# Patient Record
Sex: Female | Born: 2000 | Race: White | Hispanic: Yes | Marital: Single | Smoking: Never smoker
Health system: Southern US, Community
[De-identification: ages and names within clinical notes are randomized; demographics above are authoritative.]

## PROBLEM LIST (undated history)

## (undated) DIAGNOSIS — J45909 Unspecified asthma, uncomplicated: Secondary | ICD-10-CM

## (undated) DIAGNOSIS — F329 Major depressive disorder, single episode, unspecified: Secondary | ICD-10-CM

## (undated) DIAGNOSIS — F32A Depression, unspecified: Secondary | ICD-10-CM

## (undated) DIAGNOSIS — Z8709 Personal history of other diseases of the respiratory system: Secondary | ICD-10-CM

## (undated) HISTORY — DX: Personal history of other diseases of the respiratory system: Z87.09

## (undated) HISTORY — PX: NO PAST SURGERIES: SHX2092

## (undated) HISTORY — DX: Depression, unspecified: F32.A

## (undated) HISTORY — PX: OTHER SURGICAL HISTORY: SHX169

## (undated) HISTORY — DX: Unspecified asthma, uncomplicated: J45.909

---
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
## 2018-01-21 DIAGNOSIS — F321 Major depressive disorder, single episode, moderate: Secondary | ICD-10-CM | POA: Insufficient documentation

## 2018-08-26 DIAGNOSIS — F321 Major depressive disorder, single episode, moderate: Secondary | ICD-10-CM

## 2018-08-27 ENCOUNTER — Other Ambulatory Visit: Payer: Self-pay

## 2018-08-27 ENCOUNTER — Ambulatory Visit (LOCAL_COMMUNITY_HEALTH_CENTER): Payer: Self-pay

## 2018-08-27 VITALS — BP 110/68 | Ht 65.0 in | Wt 146.5 lb

## 2018-08-27 DIAGNOSIS — Z3201 Encounter for pregnancy test, result positive: Secondary | ICD-10-CM

## 2018-08-27 LAB — PREGNANCY, URINE: Preg Test, Ur: POSITIVE — AB

## 2018-08-27 MED ORDER — PRENATAL VITAMIN 27-0.8 MG PO TABS: 1.0000 | ORAL_TABLET | Freq: Every day | ORAL | 0 refills | Status: DC

## 2018-08-27 NOTE — Progress Notes (Signed)
Accompanied by female during visit today. Denies any medical hx or surgical hx. Due to PHQ-9 score, offered opportunity to talk with provider today. Client would like to speak with provider today. Client escorted to [place] #5 and Jerline Pain FNP-BC aware. Female with client aware he will need to wait in waiting room when provider comes in to talk with client. Shona Needles, RN

## 2018-10-08 NOTE — Progress Notes (Signed)
Chart abstraction per 10/08/18 phone interview with Tawanna Solo, RN Debera Lat, RN

## 2018-10-09 ENCOUNTER — Other Ambulatory Visit: Payer: Self-pay

## 2018-10-09 ENCOUNTER — Ambulatory Visit: Payer: Medicaid Other | Admitting: Advanced Practice Midwife

## 2018-10-09 ENCOUNTER — Encounter: Payer: Self-pay | Admitting: Advanced Practice Midwife

## 2018-10-09 VITALS — BP 106/70 | Temp 98.6°F | Wt 142.8 lb

## 2018-10-09 DIAGNOSIS — O0990 Supervision of high risk pregnancy, unspecified, unspecified trimester: Secondary | ICD-10-CM

## 2018-10-09 DIAGNOSIS — O99340 Other mental disorders complicating pregnancy, unspecified trimester: Secondary | ICD-10-CM

## 2018-10-09 DIAGNOSIS — J45909 Unspecified asthma, uncomplicated: Secondary | ICD-10-CM

## 2018-10-09 DIAGNOSIS — F329 Major depressive disorder, single episode, unspecified: Secondary | ICD-10-CM

## 2018-10-09 DIAGNOSIS — F32A Depression, unspecified: Secondary | ICD-10-CM

## 2018-10-09 LAB — HIV ANTIBODY (ROUTINE TESTING W REFLEX): HIV 1&2 Ab, 4th Generation: NONREACTIVE

## 2018-10-09 LAB — URINALYSIS
Bilirubin, UA: NEGATIVE
Glucose, UA: NEGATIVE
Leukocytes,UA: NEGATIVE
Nitrite, UA: NEGATIVE
RBC, UA: NEGATIVE
Specific Gravity, UA: 1.03 (ref 1.005–1.030)
Urobilinogen, Ur: 0.2 mg/dL (ref 0.2–1.0)
pH, UA: 6 (ref 5.0–7.5)

## 2018-10-09 LAB — HEMOGLOBIN, FINGERSTICK: Hemoglobin: 11.9 g/dL (ref 11.1–15.9)

## 2018-10-09 LAB — WET PREP FOR TRICH, YEAST, CLUE
Trichomonas Exam: NEGATIVE
Yeast Exam: NEGATIVE

## 2018-10-09 LAB — OB RESULTS CONSOLE VARICELLA ZOSTER ANTIBODY, IGG: Varicella: NON-IMMUNE/NOT IMMUNE

## 2018-10-09 NOTE — Progress Notes (Signed)
In for new ob; has PNV; c/o n/v ~2x/day; initial labs today; Debera Lat, RN Peak flows: [phone] with moderate effort; informed will be called with 1st screen appt. Debera Lat, RN

## 2018-10-09 NOTE — Progress Notes (Signed)
[facility] HEALTH DEPT [facility] [address] [place] [address] [phone]  INITIAL PRENATAL VISIT NOTE  Subjective:  Amber Potter is a 18 y.o.SHF nonsmoker G1P0 at [redacted]w[redacted]d being seen today to start prenatal care at the [facility] Department.  She is currently monitored for the following issues for this high-risk pregnancy and has Major depressive disorder, single episode, moderate (HCC); Asthma; and Supervision of high risk pregnancy, antepartum on their problem list.  Feels "ok" about unplanned pregnancy.  In [place] x1 year, in  x 15 days.  Living with FOB and in 1 year relationship.  Pt states she was in a detention center in [place] x 2 months and then sent to a hospital in [place] x 1 week and diagnosed with depression but given no meds.  Pt obviously depressed and declines need for counseling.   States LMP 07/14/18  Patient reports no complaints.  Contractions: Not present. Vag. Bleeding: None.  Movement: Absent. Denies leaking of fluid.   The following portions of the patient's history were reviewed and updated as appropriate: allergies, current medications, past family history, past medical history, past social history, past surgical history and problem list. Problem list updated.  Objective:   Vitals:   10/09/18 1330  BP: 106/70  Temp: 98.6 F (37 C)  Weight: 142 lb 12.8 oz (64.8 kg)    Fetal Status: Fetal Heart Rate (bpm): 160 Fundal Height: 12 cm Movement: Absent     Physical Exam Constitutional:      Appearance: Normal appearance.  HENT:     Head: Normocephalic and atraumatic.     Nose: Nose normal.     Mouth/Throat:     Mouth: Mucous membranes are moist.  Eyes:     Conjunctiva/sclera: Conjunctivae normal.  Neck:     Musculoskeletal: Neck supple.  Cardiovascular:     Rate and Rhythm: Normal rate and regular rhythm.  Pulmonary:     Effort: Pulmonary effort is normal.     Breath  sounds: Normal breath sounds.  Abdominal:     Palpations: Abdomen is soft.     Tenderness: There is no abdominal tenderness. There is no guarding.     Hernia: No hernia is present.     Comments: Soft without tenderness, fundus 2 FBaS, FHR=160  Genitourinary:    General: Normal vulva.     Exam position: Lithotomy position.     Labia:        Right: No lesion.        Left: No lesion.      Vagina: Vaginal discharge (white creamy, ph<4.5) present.     Cervix: Normal.     Uterus: Enlarged (10 wks size).      Adnexa: Right adnexa normal and left adnexa normal.     Rectum: Normal.  Skin:    General: Skin is dry.  Neurological:     Mental Status: She is alert.  Psychiatric:        Mood and Affect: Mood is depressed. Affect is blunt and flat.        Speech: Speech is delayed.        Behavior: Behavior is slowed and withdrawn.     Assessment and Plan:  Pregnancy: G1P0 at [redacted]w[redacted]d  1. Supervision of high risk pregnancy, antepartum Pt refuses counseling.  Monitor for depression and reoffer.  Desires CP. Desires FIRST screen.  C/o n&v--suggestions given to eat small high protein snacks q 2 hours, ginger tea, vit  B6 25 mg BID, pt desires rx for Phenergan 25 mg prn #8.  To ER if worsens  - Prenatal Profile with Varicella(282020) - HGB FRAC. W/SOLUBILITY - QuantiFERON-TB Gold Plus - Chlamydia/GC NAA, Confirmation - HIV [facility] - 952841 Drug Screen - Urine Culture & Sensitivity - Urinalysis (Urine Dip) - WET PREP FOR TRICH, YEAST, CLUE 2.  Asthma Peak flows please today    Discussed overview of care and coordination with inpatient delivery practices including [facility], [facility], [facility] and [facility].   Reviewed Centering pregnancy as standard of care at [facility], oriented to room and showed video. Based on EDD, plan for Cycle  .    Preterm labor symptoms and general obstetric precautions including but not limited to vaginal bleeding, contractions, leaking of fluid and fetal  movement were reviewed in detail with the patient.  Please refer to After Visit Summary for other counseling recommendations.   No follow-ups on file.  No future appointments.  Herbie Saxon, CNM

## 2018-10-10 LAB — 789231 7+OXYCODONE-BUND
Amphetamines, Urine: NEGATIVE ng/mL
BENZODIAZ UR QL: NEGATIVE ng/mL
Barbiturate screen, urine: NEGATIVE ng/mL
Cannabinoid Quant, Ur: NEGATIVE ng/mL
Cocaine (Metab.): NEGATIVE ng/mL
OPIATE SCREEN URINE: NEGATIVE ng/mL
Oxycodone/Oxymorphone, Urine: NEGATIVE ng/mL
PCP Quant, Ur: NEGATIVE ng/mL

## 2018-10-11 LAB — CBC/D/PLT+RPR+RH+ABO+RUB AB...
Antibody Screen: NEGATIVE
Basophils Absolute: 0 10*3/uL (ref 0.0–0.2)
Basos: 0 %
EOS (ABSOLUTE): 0.2 10*3/uL (ref 0.0–0.4)
Eos: 2 %
Hematocrit: 37.4 % (ref 34.0–46.6)
Hemoglobin: 11.9 g/dL (ref 11.1–15.9)
Hepatitis B Surface Ag: NEGATIVE
Immature Grans (Abs): 0.1 10*3/uL (ref 0.0–0.1)
Immature Granulocytes: 1 %
Lymphocytes Absolute: 3.2 10*3/uL — ABNORMAL HIGH (ref 0.7–3.1)
Lymphs: 26 %
MCH: 27.1 pg (ref 26.6–33.0)
MCHC: 31.8 g/dL (ref 31.5–35.7)
MCV: 85 fL (ref 79–97)
Monocytes Absolute: 1 10*3/uL — ABNORMAL HIGH (ref 0.1–0.9)
Monocytes: 8 %
Neutrophils Absolute: 7.9 10*3/uL — ABNORMAL HIGH (ref 1.4–7.0)
Neutrophils: 63 %
Platelets: 376 10*3/uL (ref 150–450)
RBC: 4.39 x10E6/uL (ref 3.77–5.28)
RDW: 12.8 % (ref 11.7–15.4)
RPR Ser Ql: NONREACTIVE
Rh Factor: POSITIVE
Rubella Antibodies, IGG: 9.37 index (ref >0.99)
Varicella zoster IgG: <135 index — ABNORMAL LOW (ref >165)
WBC: 12.3 10*3/uL — ABNORMAL HIGH (ref 3.4–10.8)

## 2018-10-11 LAB — HGB FRAC. W/SOLUBILITY
Hgb A2 Quant: 2.1 % (ref 1.8–3.2)
Hgb A: 97.9 % (ref 96.4–98.8)
Hgb C: 0 %
Hgb F Quant: 0 % (ref 0.0–2.0)
Hgb S: 0 %
Hgb Solubility: NEGATIVE
Hgb Variant: 0 %

## 2018-10-11 LAB — QUANTIFERON-TB GOLD PLUS
QuantiFERON Mitogen Value: >10 IU/mL
QuantiFERON Nil Value: 0.03 IU/mL
QuantiFERON TB1 Ag Value: 0.03 IU/mL
QuantiFERON TB2 Ag Value: 0.03 IU/mL
QuantiFERON-TB Gold Plus: NEGATIVE

## 2018-10-11 LAB — URINE CULTURE: Organism ID, Bacteria: NO GROWTH

## 2018-10-14 ENCOUNTER — Encounter: Payer: Self-pay | Admitting: Advanced Practice Midwife

## 2018-10-14 DIAGNOSIS — O09892 Supervision of other high risk pregnancies, second trimester: Secondary | ICD-10-CM | POA: Insufficient documentation

## 2018-10-14 DIAGNOSIS — R799 Abnormal finding of blood chemistry, unspecified: Secondary | ICD-10-CM | POA: Insufficient documentation

## 2018-10-14 LAB — CHLAMYDIA/GC NAA, CONFIRMATION
Chlamydia trachomatis, NAA: NEGATIVE
Neisseria gonorrhoeae, NAA: NEGATIVE

## 2018-11-06 ENCOUNTER — Telehealth: Payer: Self-pay

## 2018-11-06 ENCOUNTER — Ambulatory Visit: Payer: Self-pay

## 2018-11-06 NOTE — Telephone Encounter (Signed)
TC to patient for maternity telehealth appointment. LM with number to call on home phone and LM on cell phone as well. Interpreter M. [place] ([place]). Patient has now disappeared off today's schedule and apparently has rescheduled for 11/08/2018.Marland KitchenJenetta Downer, RN

## 2018-11-08 ENCOUNTER — Telehealth: Payer: Self-pay | Admitting: Family Medicine

## 2018-11-08 ENCOUNTER — Ambulatory Visit: Payer: Medicaid Other

## 2018-11-08 ENCOUNTER — Telehealth: Payer: Self-pay

## 2018-11-08 NOTE — Telephone Encounter (Signed)
pls call I missed my appt call

## 2018-11-08 NOTE — Telephone Encounter (Signed)
Attempted to call for [facility] Telehealth visit via interpreter M. [place]); female answered mobile and hung up; call to home # & per female she will pass message Debera Lat, RN

## 2018-11-08 NOTE — Telephone Encounter (Signed)
Reached client and rescheduled [place] Telehealth appt to 11/12/18; reports to call mobile# Debera Lat, RN

## 2018-11-12 ENCOUNTER — Ambulatory Visit: Payer: Self-pay

## 2018-11-14 ENCOUNTER — Other Ambulatory Visit: Payer: Self-pay

## 2018-11-14 ENCOUNTER — Ambulatory Visit: Payer: Medicaid Other | Admitting: Physician Assistant

## 2018-11-14 VITALS — BP 111/71 | Temp 99.1°F | Wt 142.8 lb

## 2018-11-14 DIAGNOSIS — O0990 Supervision of high risk pregnancy, unspecified, unspecified trimester: Secondary | ICD-10-CM

## 2018-11-14 DIAGNOSIS — F321 Major depressive disorder, single episode, moderate: Secondary | ICD-10-CM

## 2018-11-14 DIAGNOSIS — J45909 Unspecified asthma, uncomplicated: Secondary | ICD-10-CM

## 2018-11-14 DIAGNOSIS — O09892 Supervision of other high risk pregnancies, second trimester: Secondary | ICD-10-CM

## 2018-11-14 DIAGNOSIS — R799 Abnormal finding of blood chemistry, unspecified: Secondary | ICD-10-CM

## 2018-11-14 NOTE — Progress Notes (Signed)
Patient was here for MH RV at 17 3/7. Quad screen today. [facility] U/S referral faxed for anatomy U/S. Appointment pending. Varicella non-immune counseling done today and handout given. Patient counseled she will need varicella vaccine following delivery of her baby.Jenetta Downer, RN

## 2018-11-14 NOTE — Progress Notes (Signed)
   PRENATAL VISIT NOTE  Subjective:  Amber Potter is a 18 y.o. G1P0 at [redacted]w[redacted]d being seen today for ongoing prenatal care.  She is currently monitored for the following issues for this low-risk pregnancy and has Major depressive disorder, single episode, moderate ([place]); Asthma; Supervision of high risk pregnancy, antepartum; Susceptible to Varicella (non-immune), currently pregnant in second trimester; and Abnormal blood finding on their problem list.  Patient reports no complaints.  Contractions: Not present. Vag. Bleeding: None.  Movement: Absent. Denies leaking of fluid/ROM.   The following portions of the patient's history were reviewed and updated as appropriate: allergies, current medications, past family history, past medical history, past social history, past surgical history and problem list. Problem list updated.  Objective:   Vitals:   11/14/18 0916  BP: 111/71  Temp: 99.1 F (37.3 C)  Weight: 142 lb 12.8 oz (64.8 kg)    Fetal Status: Fetal Heart Rate (bpm): 144 Fundal Height: 17 cm Movement: Absent     General:  Alert, oriented and cooperative. Patient is in no acute distress.  Skin: Skin is warm and dry. No rash noted.   Cardiovascular: Normal heart rate noted  Respiratory: Normal respiratory effort, no problems with respiration noted  Abdomen: Soft, gravid, appropriate for gestational age.  Pain/Pressure: Absent     Pelvic: Cervical exam deferred        Extremities: Normal range of motion.  Edema: None  Mental Status: Normal mood and affect. Normal behavior. Normal judgment and thought content.   Assessment and Plan:  Pregnancy: G1P0 at [redacted]w[redacted]d  1. Susceptible to Varicella (non-immune), currently pregnant in second trimester Counseled.  2. Major depressive disorder, single episode, moderate (HCC) States mood is "so-so". Is aware of LCSW counseling services avail - declines.  3. Asthma, unspecified asthma severity, unspecified whether complicated, unspecified  whether persistent Declines sx.  4. Abnormal blood finding Repeat WBC/diff today - White blood count and differential  5. Supervision of high risk pregnancy, antepartum Schedule anat [place], quad screen today. - QUAD Screen [facility] Only   Preterm labor symptoms and general obstetric precautions including but not limited to vaginal bleeding, contractions, leaking of fluid and fetal movement were reviewed in detail with the patient. Please refer to After Visit Summary for other counseling recommendations.  Return in about 4 weeks (around 12/12/2018) for Routine prenatal care, Telehealth.  No future appointments.  Lora Havens, PA-C

## 2018-11-15 ENCOUNTER — Other Ambulatory Visit: Payer: Self-pay | Admitting: Physician Assistant

## 2018-11-15 DIAGNOSIS — Z3492 Encounter for supervision of normal pregnancy, unspecified, second trimester: Secondary | ICD-10-CM

## 2018-11-15 LAB — WHITE BLOOD COUNT AND DIFFERENTIAL
Basophils Absolute: 0 10*3/uL (ref 0.0–0.2)
Basos: 0 %
EOS (ABSOLUTE): 0.1 10*3/uL (ref 0.0–0.4)
Eos: 1 %
Immature Grans (Abs): 0.2 10*3/uL — ABNORMAL HIGH (ref 0.0–0.1)
Immature Granulocytes: 2 %
Lymphocytes Absolute: 2.3 10*3/uL (ref 0.7–3.1)
Lymphs: 20 %
Monocytes Absolute: 0.8 10*3/uL (ref 0.1–0.9)
Monocytes: 7 %
Neutrophils Absolute: 8.2 10*3/uL — ABNORMAL HIGH (ref 1.4–7.0)
Neutrophils: 70 %
WBC: 11.7 10*3/uL — ABNORMAL HIGH (ref 3.4–10.8)

## 2018-11-18 ENCOUNTER — Telehealth: Payer: Self-pay

## 2018-11-18 NOTE — Telephone Encounter (Signed)
TC to patient to inform of WBC test results and ask patient if she has symptoms of an infections (ie. Fever, chills, body aches). WBC has come down from previous value, but is still elevated. Outgoing message on this phone was in [place], unsure if this is correct phone number. LM with number to call. Before this call, call was made to cell [phone] and female answered suggesting that we call patient's phone, and he verified that the home number we have in chart is patient's phone. Please verify patient phone numbers at next visit. Interpreter, M. [place] ([place]Jenetta Downer, RN

## 2018-11-19 NOTE — Telephone Encounter (Signed)
TC to patient, who states she has no fever, chills or body aches and feels fine. Patient informed that provider wanted RN to check on how she is feeling because although her WBC value is coming down, it is still slightly elevated. Explained to patient that high WBC can indicate an infection, and that because her WBC is decreasing from being elevated, this is a positive thing. Patient told we would recheck her WBC at next visit. Patient denies questions at this time. Interpreter V. [place].Jenetta Downer, RN

## 2018-11-27 ENCOUNTER — Other Ambulatory Visit: Payer: Self-pay

## 2018-11-27 ENCOUNTER — Ambulatory Visit
Admission: RE | Admit: 2018-11-27 | Discharge: 2018-11-27 | Disposition: A | Discharge status: Home / Self Care | Payer: Self-pay | Source: Ambulatory Visit | Attending: Physician Assistant | Admitting: Physician Assistant

## 2018-11-27 DIAGNOSIS — Z3492 Encounter for supervision of normal pregnancy, unspecified, second trimester: Secondary | ICD-10-CM | POA: Insufficient documentation

## 2018-11-28 ENCOUNTER — Encounter: Payer: Self-pay | Admitting: Physician Assistant

## 2018-11-28 DIAGNOSIS — O0990 Supervision of high risk pregnancy, unspecified, unspecified trimester: Secondary | ICD-10-CM

## 2018-12-11 ENCOUNTER — Encounter: Payer: Self-pay | Admitting: Advanced Practice Midwife

## 2018-12-11 ENCOUNTER — Other Ambulatory Visit: Payer: Self-pay

## 2018-12-11 ENCOUNTER — Ambulatory Visit: Payer: Self-pay | Admitting: Family Medicine

## 2018-12-11 VITALS — BP 95/69 | Temp 97.3°F | Wt 144.0 lb

## 2018-12-11 DIAGNOSIS — F321 Major depressive disorder, single episode, moderate: Secondary | ICD-10-CM

## 2018-12-11 DIAGNOSIS — R799 Abnormal finding of blood chemistry, unspecified: Secondary | ICD-10-CM

## 2018-12-11 DIAGNOSIS — O0990 Supervision of high risk pregnancy, unspecified, unspecified trimester: Secondary | ICD-10-CM

## 2018-12-11 DIAGNOSIS — O09892 Supervision of other high risk pregnancies, second trimester: Secondary | ICD-10-CM

## 2018-12-11 DIAGNOSIS — J45909 Unspecified asthma, uncomplicated: Secondary | ICD-10-CM

## 2018-12-11 MED ORDER — PRENATAL VITAMIN AND MINERAL 28-0.8 MG PO TABS: 1.0000 | ORAL_TABLET | Freq: Every day | ORAL | 0 refills | Status: DC

## 2018-12-11 NOTE — Progress Notes (Signed)
   PRENATAL VISIT NOTE  Subjective:  Amber Potter is a 18 y.o. G1P0 at [redacted]w[redacted]d being seen today for ongoing prenatal care.  She is currently monitored for the following issues for this high-risk pregnancy and has Major depressive disorder, single episode, moderate ([place]); Asthma; Supervision of high risk pregnancy, antepartum; Susceptible to Varicella (non-immune), currently pregnant in second trimester; and Abnormal blood finding 10/09/18 leukocytosis (WBC=12.3, 11.7) on their problem list. Pt's boyfriend is present during visit.  Patient reports no complaints. She has asthma, though denies wheeze, SOB or difficulty breathing. Has inhaler but has not needed to use.  WBC has been high past few visits. She denies feeling ill, fever, urinary problems, diarrhea or travel within the last year.    Contractions: Not present. Vag. Bleeding: None.  Movement: Absent. Denies leaking of fluid/ROM.   The following portions of the patient's history were reviewed and updated as appropriate: allergies, current medications, past family history, past medical history, past social history, past surgical history and problem list. Problem list updated.  Objective:   Vitals:   12/11/18 0841  BP: 95/69  Temp: (!) 97.3 F (36.3 C)  Weight: 144 lb (65.3 kg)    Fetal Status:     Movement: Absent     General:  Alert, oriented and cooperative. Patient is in no acute distress.  Skin: Skin is warm and dry. No rash noted.   Cardiovascular: Normal heart rate noted  Respiratory: Normal respiratory effort, no problems with respiration noted  Abdomen: Soft, gravid, appropriate for gestational age.  Pain/Pressure: Absent     Pelvic: Cervical exam deferred        Extremities: Normal range of motion.  Edema: None  Mental Status: Normal mood and affect. Normal behavior. Normal judgment and thought content.   Assessment and Plan:  Pregnancy: G1P0 at [redacted]w[redacted]d  1. Supervision of high risk pregnancy, antepartum Up to  date. Return in 4 wks  - Flu Vaccine QUAD 36+ mos IM - Prenatal Vit-Fe Fumarate-FA (PRENATAL VITAMIN AND MINERAL) 28-0.8 MG TABS; Take 1 tablet by mouth daily.  Dispense: 100 tablet; Refill: 0  2. Asthma, unspecified asthma severity, unspecified whether complicated, unspecified whether persistent Mild, no use of inhaler. Will continue to monitor.   3. Abnormal blood finding 10/09/18 leukocytosis (WBC=12.3, 11.7) No signs of infection upon questioning. Will repeat CBC today in follow up to abnormal findings 10/29 and 9/23. - CBC with Differential/Platelet  4. Major depressive disorder, single episode, moderate ([place]) She has declined behavioral health referral.   5. Susceptible to Varicella (non-immune), currently pregnant in second trimester Needs vaccine pp.   Preterm labor symptoms and general obstetric precautions including but not limited to vaginal bleeding, contractions, leaking of fluid and fetal movement were reviewed in detail with the patient. Please refer to After Visit Summary for other counseling recommendations.  No follow-ups on file.  No future appointments.  Kandee Keen, PA-C

## 2018-12-11 NOTE — Progress Notes (Signed)
In for visit; ran out of PNV-more given today; denies hospital visits; agrees to Flu vaccine-adm. R. delt well tolerated Debera Lat, RN

## 2018-12-12 LAB — CBC WITH DIFFERENTIAL/PLATELET
Basophils Absolute: 0 10*3/uL (ref 0.0–0.2)
Basos: 0 %
EOS (ABSOLUTE): 0.2 10*3/uL (ref 0.0–0.4)
Eos: 2 %
Hematocrit: 37.6 % (ref 34.0–46.6)
Hemoglobin: 11.6 g/dL (ref 11.1–15.9)
Immature Grans (Abs): 0.2 10*3/uL — ABNORMAL HIGH (ref 0.0–0.1)
Immature Granulocytes: 2 %
Lymphocytes Absolute: 2.3 10*3/uL (ref 0.7–3.1)
Lymphs: 20 %
MCH: 27.1 pg (ref 26.6–33.0)
MCHC: 30.9 g/dL — ABNORMAL LOW (ref 31.5–35.7)
MCV: 88 fL (ref 79–97)
Monocytes Absolute: 0.6 10*3/uL (ref 0.1–0.9)
Monocytes: 6 %
Neutrophils Absolute: 7.9 10*3/uL — ABNORMAL HIGH (ref 1.4–7.0)
Neutrophils: 70 %
Platelets: 354 10*3/uL (ref 150–450)
RBC: 4.28 x10E6/uL (ref 3.77–5.28)
RDW: 12 % (ref 11.7–15.4)
WBC: 11.2 10*3/uL — ABNORMAL HIGH (ref 3.4–10.8)

## 2019-01-08 ENCOUNTER — Other Ambulatory Visit: Payer: Self-pay

## 2019-01-08 ENCOUNTER — Ambulatory Visit: Payer: Self-pay | Admitting: Physician Assistant

## 2019-01-08 ENCOUNTER — Ambulatory Visit: Payer: Self-pay

## 2019-01-08 VITALS — BP 103/63 | HR 93 | Temp 99.0°F | Wt 148.2 lb

## 2019-01-08 DIAGNOSIS — Z3490 Encounter for supervision of normal pregnancy, unspecified, unspecified trimester: Secondary | ICD-10-CM

## 2019-01-08 DIAGNOSIS — F321 Major depressive disorder, single episode, moderate: Secondary | ICD-10-CM

## 2019-01-08 DIAGNOSIS — O0990 Supervision of high risk pregnancy, unspecified, unspecified trimester: Secondary | ICD-10-CM

## 2019-01-08 DIAGNOSIS — O09892 Supervision of other high risk pregnancies, second trimester: Secondary | ICD-10-CM

## 2019-01-08 DIAGNOSIS — Z2839 Other underimmunization status: Secondary | ICD-10-CM

## 2019-01-08 DIAGNOSIS — Z550 Illiteracy and low-level literacy: Secondary | ICD-10-CM

## 2019-01-08 NOTE — Progress Notes (Signed)
In for visit; taking PNV; denies hospital visits; undecided on [place]. Given; [place] forms completed Debera Lat, RN

## 2019-01-08 NOTE — Progress Notes (Signed)
   PRENATAL VISIT NOTE  Subjective:  Amber Potter is a 18 y.o. G1P0 at [redacted]w[redacted]d being seen today for ongoing prenatal care.  She is currently monitored for the following issues for this high-risk pregnancy and has Major depressive disorder, single episode, moderate ([place]); Asthma; Supervision of high risk pregnancy, antepartum; Susceptible to Varicella (non-immune), currently pregnant in second trimester; and Abnormal blood finding 10/09/18 leukocytosis (WBC=12.3, 11.7) on their problem list.  Patient reports no complaints.  Contractions: Not present. Vag. Bleeding: None.  Movement: Present. Denies leaking of fluid/ROM.   The following portions of the patient's history were reviewed and updated as appropriate: allergies, current medications, past family history, past medical history, past social history, past surgical history and problem list. Problem list updated.  Objective:   Vitals:   01/08/19 0851  BP: 103/63  Pulse: 93  Temp: 99 F (37.2 C)  Weight: 148 lb 3.2 oz (67.2 kg)    Fetal Status: Fetal Heart Rate (bpm): 144 Fundal Height: 24 cm Movement: Present     General:  Alert, oriented and cooperative. Patient is in no acute distress.  Skin: Skin is warm and dry. No rash noted.   Cardiovascular: Normal heart rate noted  Respiratory: Normal respiratory effort, no problems with respiration noted  Abdomen: Soft, gravid, appropriate for gestational age.  Pain/Pressure: Absent     Pelvic: Cervical exam deferred        Extremities: Normal range of motion.  Edema: None  Mental Status: Normal mood and affect. Normal behavior. Normal judgment and thought content.   Assessment and Plan:  Pregnancy: G1P0 at [redacted]w[redacted]d  1. Susceptible to Varicella (non-immune), currently pregnant in second trimester Plan pp vaccination.  2. Supervision of high risk pregnancy, antepartum Doing well. Anticipatory guidance re: 28-wk labs at RV.  3. Major depressive disorder, single episode, moderate  (HCC) EPDS = 5 today, continue to monitor mood.   Preterm labor symptoms and general obstetric precautions including but not limited to vaginal bleeding, contractions, leaking of fluid and fetal movement were reviewed in detail with the patient. Please refer to After Visit Summary for other counseling recommendations.  Return in about 2 weeks (around 01/22/2019) for Routine prenatal care, 28 wk labs.  No future appointments.  Lora Havens, PA-C

## 2019-01-17 NOTE — L&D Delivery Note (Signed)
Delivery Note  Amber Potter is a G1P0 at [redacted]w[redacted]d with an LMP of 07/14/2018, consistent with [place] at [redacted]w[redacted]d.   First Stage: Labor onset: 1600 04/22/2019 Augmentation : with pitocin  Analgesia /Anesthesia intrapartum: Epidural SROM at 0935  Second Stage: Complete dilation at 1438 Onset of pushing at 1502 FHR second stage 120bpm with moderate variability, intermittent variables, prolong decel terminating in birth of baby  Amber Potter presented to L&D with regular, painful contractions, in early labor.  Labor augmented with pitocin.  Progressed well to c/c/+3 and had excellent maternal effort with pushing.   Delivery of a viable baby boy 4/7/2021at 1508 by CNM Delivery of fetal head in OA position with restitution to LOT. No nuchal cord;  Anterior then posterior shoulders delivered easily with gentle downward traction. Baby placed on mom's chest, and attended to by peds. Cord double clamped after cessation of pulsation, cut by father of baby   Cord blood sample collected: yes, [place] Collection of cord blood donation: N/A Arterial cord blood sample: N/A  Third Stage: Oxytocin bolus started after delivery of infant for hemorrhage prophylaxis  Placenta delivered intact with 3 VC @ 1514, meconium stained  Placenta disposition: discarded  Uterine tone firm / bleeding moderate  2nd degree perineal laceration identified  Anesthesia for repair: epidural Repair with 2-0 Vicryl on CT  Est. Blood Loss (mL): 225  Complications: Meconium stained fluid   Mom to postpartum.  Baby to Couplet care / Skin to Skin.  Newborn: Baby boy [place] Birth Weight: pending   Apgar Scores: 8/8 Feeding planned: breast/formula   ---------- Margaretmary Eddy, CNM Certified Nurse Midwife [facility] OB[facility]

## 2019-01-22 ENCOUNTER — Other Ambulatory Visit: Payer: Self-pay

## 2019-01-22 ENCOUNTER — Ambulatory Visit: Payer: Self-pay | Admitting: Family Medicine

## 2019-01-22 ENCOUNTER — Encounter: Payer: Self-pay | Admitting: Family Medicine

## 2019-01-22 VITALS — BP 112/62 | HR 90 | Temp 97.0°F | Wt 146.4 lb

## 2019-01-22 DIAGNOSIS — O0990 Supervision of high risk pregnancy, unspecified, unspecified trimester: Secondary | ICD-10-CM

## 2019-01-22 DIAGNOSIS — Z3490 Encounter for supervision of normal pregnancy, unspecified, unspecified trimester: Secondary | ICD-10-CM

## 2019-01-22 DIAGNOSIS — O09892 Supervision of other high risk pregnancies, second trimester: Secondary | ICD-10-CM

## 2019-01-22 DIAGNOSIS — J45909 Unspecified asthma, uncomplicated: Secondary | ICD-10-CM

## 2019-01-22 DIAGNOSIS — O99619 Diseases of the digestive system complicating pregnancy, unspecified trimester: Secondary | ICD-10-CM

## 2019-01-22 DIAGNOSIS — K219 Gastro-esophageal reflux disease without esophagitis: Secondary | ICD-10-CM

## 2019-01-22 DIAGNOSIS — Z23 Encounter for immunization: Secondary | ICD-10-CM

## 2019-01-22 DIAGNOSIS — F321 Major depressive disorder, single episode, moderate: Secondary | ICD-10-CM

## 2019-01-22 LAB — URINALYSIS
Bilirubin, UA: NEGATIVE
Glucose, UA: NEGATIVE
Ketones, UA: NEGATIVE
Leukocytes,UA: NEGATIVE
Nitrite, UA: NEGATIVE
Protein,UA: NEGATIVE
Specific Gravity, UA: 1.025 (ref 1.005–1.030)
Urobilinogen, Ur: 0.2 mg/dL (ref 0.2–1.0)
pH, UA: 6.5 (ref 5.0–7.5)

## 2019-01-22 LAB — HEMOGLOBIN, FINGERSTICK: Hemoglobin: 11.2 g/dL (ref 11.1–15.9)

## 2019-01-22 LAB — OB RESULTS CONSOLE HIV ANTIBODY (ROUTINE TESTING): HIV: NONREACTIVE

## 2019-01-22 NOTE — Progress Notes (Signed)
In for visit; denies hospital visits; taking PNV; 28 wk. Labs today; agrees to Tdap; undecided on [facility]; c/o N/V q. Day Sharlette Dense, RN

## 2019-01-22 NOTE — Progress Notes (Signed)
   PRENATAL VISIT NOTE  Subjective:  Amber Potter is a 19 y.o. G1P0 at [redacted]w[redacted]d being seen today for ongoing prenatal care.  She is currently monitored for the following issues for this low-risk pregnancy and has Major depressive disorder, single episode, moderate (HCC); Asthma; Supervision of high risk pregnancy, antepartum; Susceptible to Varicella (non-immune), currently pregnant in second trimester; Abnormal blood finding 10/09/18 leukocytosis (WBC=12.3, 11.7); and Illiteracy on their problem list.  Patient reports heartburn and vomiting x 2 wks. Burning in upper abd and throat, vomits <2 hrs after eating.  Contractions: Not present. Vag. Bleeding: None.  Movement: Present. Denies leaking of fluid/ROM.   The following portions of the patient's history were reviewed and updated as appropriate: allergies, current medications, past family history, past medical history, past social history, past surgical history and problem list. Problem list updated.  Objective:   Vitals:   01/22/19 0908  BP: 112/62  Pulse: 90  Temp: (!) 97 F (36.1 C)  Weight: 146 lb 6.4 oz (66.4 kg)    Fetal Status: Fetal Heart Rate (bpm): 150 Fundal Height: 26 cm Movement: Present     General:  Alert, oriented and cooperative. Patient is in no acute distress.  Skin: Skin is warm and dry. No rash noted.   Cardiovascular: Normal heart rate noted  Respiratory: Normal respiratory effort, no problems with respiration noted  Abdomen: Soft, gravid, appropriate for gestational age.  Pain/Pressure: Absent     Pelvic: Cervical exam deferred        Extremities: Normal range of motion.  Edema: None  Mental Status: Normal mood and affect. Normal behavior. Normal judgment and thought content.   Assessment and Plan:  Pregnancy: G1P0 at [redacted]w[redacted]d     1. Supervision of high risk pregnancy, antepartum 28 wk labs today. Advised of appts every 2 wks now.  - HIV [place] [facility] - Glucose, 1 hour gestational - RPR - Hemoglobin,  venipuncture - Tdap vaccine greater than or equal to 7yo IM  2. Gastroesophageal reflux in pregnancy -Advised eating small meals and avoiding common trigger foods/drinks, elevating head at nighttime, not reclining immediately after eating, and using Tums as needed. If that fails advised trial of famotidine (Pepcid) 10mg  twice per day. Handout with information given/pictures drawn as well.  -Urine obtained today d/t reports of frequent vomiting, results without ketones. If vomiting does not resolve with GERD tx advised pt to RTC.  3. Major depressive disorder, single episode, moderate (HCC) Mood discussed, pt states she is doing ok, has lots of stress but declines beh health referral.  4. Susceptible to Varicella (non-immune), currently pregnant in second trimester Pp vaccination  5. Uncomplicated asthma, unspecified asthma severity, unspecified whether persistent Pt has not needed albuterol recently.   6. Need for tetanus, diphtheria, and acellular pertussis (Tdap) vaccine - Tdap vaccine greater than or equal to 7yo IM    Preterm labor symptoms and general obstetric precautions including but not limited to vaginal bleeding, contractions, leaking of fluid and fetal movement were reviewed in detail with the patient. Please refer to After Visit Summary for other counseling recommendations.  Return in about 2 weeks (around 02/05/2019) for routine prenatal care.  No future appointments.  02/07/2019, PA-C

## 2019-01-23 ENCOUNTER — Telehealth: Payer: Self-pay

## 2019-01-23 DIAGNOSIS — O99810 Abnormal glucose complicating pregnancy: Secondary | ICD-10-CM | POA: Insufficient documentation

## 2019-01-23 LAB — RPR: RPR Ser Ql: NONREACTIVE

## 2019-01-23 LAB — GLUCOSE, 1 HOUR GESTATIONAL: Gestational Diabetes Screen: 139 mg/dL (ref 65–139)

## 2019-01-23 NOTE — Telephone Encounter (Signed)
Call to client via interpreter V. [place]-discussed abnl glucose result & 3 Hr Gtt scheduled 01/27/19 @ 8:15.  Instructions given Sharlette Dense, RN

## 2019-01-27 ENCOUNTER — Other Ambulatory Visit: Payer: Self-pay

## 2019-01-27 VITALS — Wt 146.0 lb

## 2019-01-27 DIAGNOSIS — O0990 Supervision of high risk pregnancy, unspecified, unspecified trimester: Secondary | ICD-10-CM

## 2019-01-27 NOTE — Progress Notes (Signed)
Patient in nurse clinic today for 3 hour GTT. Instructions given, patient sent to lab. No history of travel noted. Richmond Campbell, RN

## 2019-01-28 LAB — GLUCOSE TOLERANCE TEST, 6 HOUR
Glucose, 1 Hour GTT: 162 mg/dL (ref 65–199)
Glucose, 2 hour: 142 mg/dL — ABNORMAL HIGH (ref 65–139)
Glucose, 3 hour: 102 mg/dL (ref 65–109)
Glucose, GTT - Fasting: 80 mg/dL (ref 65–99)

## 2019-01-30 ENCOUNTER — Telehealth: Payer: Self-pay

## 2019-01-30 NOTE — Telephone Encounter (Signed)
Attempted to call client via interpreter V. [place]; left voicemail message informing 3 Hr Gtt WNL & if ?'s to call back Sharlette Dense, RN

## 2019-02-05 ENCOUNTER — Ambulatory Visit: Payer: Self-pay

## 2019-02-18 ENCOUNTER — Ambulatory Visit: Payer: Self-pay | Admitting: Advanced Practice Midwife

## 2019-02-18 ENCOUNTER — Other Ambulatory Visit: Payer: Self-pay

## 2019-02-18 VITALS — BP 106/63 | HR 87 | Temp 98.2°F | Wt 152.8 lb

## 2019-02-18 DIAGNOSIS — O99810 Abnormal glucose complicating pregnancy: Secondary | ICD-10-CM

## 2019-02-18 DIAGNOSIS — J45909 Unspecified asthma, uncomplicated: Secondary | ICD-10-CM

## 2019-02-18 DIAGNOSIS — O0990 Supervision of high risk pregnancy, unspecified, unspecified trimester: Secondary | ICD-10-CM

## 2019-02-18 NOTE — Progress Notes (Signed)
   PRENATAL VISIT NOTE  Subjective:  Amber Potter is a 19 y.o. G1P0 at [redacted]w[redacted]d being seen today for ongoing prenatal care.  She is currently monitored for the following issues for this high-risk pregnancy and has Major depressive disorder, single episode, moderate (HCC); Asthma; Supervision of high risk pregnancy, antepartum; Susceptible to Varicella (non-immune), currently pregnant in second trimester; Abnormal blood finding 10/09/18 leukocytosis (WBC=12.3, 11.7); Illiteracy; Gastroesophageal reflux in pregnancy; and Abnormal 1 hr GTT, 3 hr wnl on their problem list.  Patient reports no complaints.  Contractions: Not present. Vag. Bleeding: None.  Movement: Present. Denies leaking of fluid/ROM.   The following portions of the patient's history were reviewed and updated as appropriate: allergies, current medications, past family history, past medical history, past social history, past surgical history and problem list. Problem list updated.  Objective:   Vitals:   02/18/19 1010  Weight: 152 lb 12.8 oz (69.3 kg)    Fetal Status: Fetal Heart Rate (bpm): 150 Fundal Height: 31 cm Movement: Present     General:  Alert, oriented and cooperative. Patient is in no acute distress.  Skin: Skin is warm and dry. No rash noted.   Cardiovascular: Normal heart rate noted  Respiratory: Normal respiratory effort, no problems with respiration noted  Abdomen: Soft, gravid, appropriate for gestational age.  Pain/Pressure: Absent     Pelvic: Cervical exam deferred        Extremities: Normal range of motion.  Edema: None  Mental Status: Normal mood and affect. Normal behavior. Normal judgment and thought content.   Assessment and Plan:  Pregnancy: G1P0 at [redacted]w[redacted]d  1. Abnormal glucose tolerance affecting pregnancy, antepartum wnl on 01/27/19  2. Supervision of high risk pregnancy, antepartum Living with FOB.  Not working.  Denies depressive sxs except being "overly sensitive"; discussed counseling  with Jamie Brookes will consider  3. Asthma, unspecified asthma severity, unspecified whether complicated, unspecified whether persistent Denies wheezing, SOB, nocturnal coughing.  States has no Albuteral--rx given   Preterm labor symptoms and general obstetric precautions including but not limited to vaginal bleeding, contractions, leaking of fluid and fetal movement were reviewed in detail with the patient. Please refer to After Visit Summary for other counseling recommendations.  No follow-ups on file.  Future Appointments  Date Time Provider Department Center  03/04/2019  8:40 AM [facility] PROVIDER [facility] None    Alberteen Spindle, CNM

## 2019-02-18 NOTE — Progress Notes (Addendum)
Patient here with partner at 7 2/7. Kick counts reviewed and cards given. Pediatrician list given today.Burt Knack, RN

## 2019-02-24 ENCOUNTER — Other Ambulatory Visit: Payer: Self-pay | Admitting: Certified Nurse Midwife

## 2019-02-24 ENCOUNTER — Telehealth: Payer: Self-pay | Admitting: Family Medicine

## 2019-02-24 ENCOUNTER — Other Ambulatory Visit: Payer: Self-pay

## 2019-02-24 ENCOUNTER — Encounter: Payer: Self-pay | Admitting: Obstetrics and Gynecology

## 2019-02-24 ENCOUNTER — Observation Stay
Admission: EM | Admit: 2019-02-24 | Discharge: 2019-02-24 | Disposition: A | Discharge status: Home / Self Care | Payer: Self-pay | Attending: Certified Nurse Midwife | Admitting: Certified Nurse Midwife

## 2019-02-24 DIAGNOSIS — O4693 Antepartum hemorrhage, unspecified, third trimester: Principal | ICD-10-CM | POA: Insufficient documentation

## 2019-02-24 DIAGNOSIS — O99810 Abnormal glucose complicating pregnancy: Secondary | ICD-10-CM

## 2019-02-24 DIAGNOSIS — Z3A32 32 weeks gestation of pregnancy: Secondary | ICD-10-CM | POA: Insufficient documentation

## 2019-02-24 DIAGNOSIS — O09892 Supervision of other high risk pregnancies, second trimester: Secondary | ICD-10-CM

## 2019-02-24 DIAGNOSIS — Z2839 Other underimmunization status: Secondary | ICD-10-CM

## 2019-02-24 LAB — OB RESULTS CONSOLE GC/CHLAMYDIA
Chlamydia: NEGATIVE
Gonorrhea: NEGATIVE

## 2019-02-24 LAB — WET PREP, GENITAL
Sperm: NONE SEEN
Trich, Wet Prep: NONE SEEN
Yeast Wet Prep HPF POC: NONE SEEN

## 2019-02-24 LAB — CHLAMYDIA/NGC RT PCR (ARMC ONLY)
Chlamydia Tr: NOT DETECTED
N gonorrhoeae: NOT DETECTED

## 2019-02-24 NOTE — Telephone Encounter (Addendum)
Returned patient phone call. Interpretation provided by M. Yemen, Water engineer. Patient reports she experienced vaginal bleeding this morning around 2 am. Patient states she noticed the bleeding when she wiped and did not experience any further  vaginal bleeding until 9:00 am this morning and states she has been "spotting since that time." Patient states blood is bright red in color. Patient is 32.[redacted] weeks pregnant. States last sex was last night. States baby is moving well. Denies burning stinging with urination. RN consulted with provider Maximiano Coss, PA-C which recommended that patient go to hospital for evaluation of above. Same instructions given to patient which states she will have someone take her to the hospital for above. Tawny Hopping, RN

## 2019-02-24 NOTE — Telephone Encounter (Signed)
PATIENT HAD SOME BLEEDING YESTERDAY AND AGAIN THIS MORNING, SHE IS CONCERNED AND WOULD LIKE TO SPEAK TO NURSE

## 2019-02-24 NOTE — Discharge Summary (Signed)
   Amber Potter is a 19 y.o. female. She is at [redacted]w[redacted]d gestation. Patient's last menstrual period was 07/14/2018 (exact date). Estimated Date of Delivery: 04/20/19  Prenatal care site: [facility]   Chief complaint: vaginal bleeding Location: vagina Onset/timing: this morning around 0200 Duration: intermittent Quality: bleeding with wiping when using the bathroom Severity: mild Aggravating or alleviating conditions: none Associated signs/symptoms: round ligament pain.  Context: Patient reports she experienced vaginal bleeding this morning around 0200. Patient states she noticed the bleeding when she wiped and did not experience any further  vaginal bleeding until 0900 today and states she has been "spotting since that time." She has been wearing the same pad all day that has a small amount of bleeding. Last intercourse last night. Reports good fetal movement. No dysuria.   S: Resting comfortably.   She reports:  -active fetal movement -no leakage of fluid -no contractions  Maternal Medical History:   Past Medical History:  Diagnosis Date  . Depression    No meds  . History of asthma    Has inhaler--not used in months    History reviewed. No pertinent surgical history.  No Known Allergies  Prior to Admission medications   Medication Sig Start Date End Date Taking? Authorizing Provider  Prenatal Vit-Fe Fumarate-FA (PRENATAL VITAMIN AND MINERAL) 28-0.8 MG TABS Take 1 tablet by mouth daily. 12/11/18   Sciora, Austin Miles, CNM     Social History: She  reports that she has never smoked. She has never used smokeless tobacco. She reports that she does not drink alcohol or use drugs.  Family History: family history is not on file.   Review of Systems: A full review of systems was performed and negative except as noted in the HPI.     O:  BP 102/64 (BP Location: Left Arm)   Pulse 85   Temp 98.7 F (37.1 C)   Resp 17   LMP 07/14/2018 (Exact Date)  No results found for this  or any previous visit (from the past 48 hour(s)).   Constitutional: NAD, AAOx3  HE/ENT: extraocular movements grossly intact, moist mucous membranes CV: RRR PULM: normal respiratory effort, CTABL     Abd: gravid, non-tender, non-distended, soft      Ext: Non-tender, Nonedmeatous   Psych: mood appropriate, speech normal Pelvic: small amount of blood tinged mucous at cervical os, no active bleeding and no pooling of blood, cervix visually closed  NST:  Baseline: 135-140bpm Variability: moderate Accelerations: 15x15 present x >2 Decelerations: absent Time: Toco: quiet   A/P: 19 y.o. [redacted]w[redacted]d here for antenatal surveillance during pregnancy.  Principle diagnosis: vaginal spotting during pregnancy  Labor  Not present  Fetal Wellbeing  Reactive NST, reassuring for GA  Vaginal bleeding  No active bleeding on speculum exam  Reviewed that spotting was likely cervical irritation after intercourse  GC/CT and wet prep collected to rule out infection. Will follow up with any positive results.   D/c home stable, precautions reviewed, follow-up as scheduled.   Next prenatal visit 03/04/2019 per patient   Genia Del 02/24/2019 4:30 PM  ----- Genia Del, CNM Certified Nurse Midwife [facility], Department of OB/GYN [facility]

## 2019-02-25 ENCOUNTER — Telehealth: Payer: Self-pay

## 2019-02-25 NOTE — Telephone Encounter (Signed)
Phone call to patient with the assistance of V. Olmedo, agency interpreter. Informed patient of chart message from M. Haviland, CNM stating that medication has been prescribed by her for patient to pharmacy choice for +BV lab results from 02/24/2019 [facility] ED visit. Instructed patient to pick up medication today if possible and begin medication as soon as she picks that up. Patient verbalized understanding of same. Tawny Hopping, RN

## 2019-02-25 NOTE — Addendum Note (Signed)
Addended by: Heywood Bene on: 02/25/2019 09:48 AM   Modules accepted: Orders

## 2019-03-04 ENCOUNTER — Ambulatory Visit: Payer: Self-pay | Admitting: Family Medicine

## 2019-03-04 ENCOUNTER — Other Ambulatory Visit: Payer: Self-pay

## 2019-03-04 VITALS — BP 101/59 | HR 78 | Temp 98.1°F | Wt 152.6 lb

## 2019-03-04 DIAGNOSIS — O4690 Antepartum hemorrhage, unspecified, unspecified trimester: Secondary | ICD-10-CM

## 2019-03-04 DIAGNOSIS — O99810 Abnormal glucose complicating pregnancy: Secondary | ICD-10-CM

## 2019-03-04 DIAGNOSIS — Z550 Illiteracy and low-level literacy: Secondary | ICD-10-CM

## 2019-03-04 DIAGNOSIS — F321 Major depressive disorder, single episode, moderate: Secondary | ICD-10-CM

## 2019-03-04 DIAGNOSIS — Z283 Underimmunization status: Secondary | ICD-10-CM

## 2019-03-04 DIAGNOSIS — O09892 Supervision of other high risk pregnancies, second trimester: Secondary | ICD-10-CM

## 2019-03-04 DIAGNOSIS — O0990 Supervision of high risk pregnancy, unspecified, unspecified trimester: Secondary | ICD-10-CM

## 2019-03-04 NOTE — Progress Notes (Signed)
PRENATAL VISIT NOTE  Subjective:  Amber Potter is a 19 y.o. G1P0 at [redacted]w[redacted]d being seen today for ongoing prenatal care.  She is currently monitored for the following issues for this high-risk pregnancy and has Major depressive disorder, single episode, moderate (HCC); Asthma; Supervision of high risk pregnancy, antepartum; Susceptible to Varicella (non-immune), currently pregnant in second trimester; Abnormal blood finding 10/09/18 leukocytosis (WBC=12.3, 11.7); Illiteracy; Gastroesophageal reflux in pregnancy; Abnormal 1 hr GTT, 3 hr wnl; and Vaginal bleeding in pregnancy, third trimester on their problem list.  Patient reports no complaints.  Contractions: Irritability.  .  Movement: Present. Denies leaking of fluid/ROM.   The following portions of the patient's history were reviewed and updated as appropriate: allergies, current medications, past family history, past medical history, past social history, past surgical history and problem list. Problem list updated.  Objective:   Vitals:   03/04/19 0826  BP: (!) 101/59  Pulse: 78  Temp: 98.1 F (36.7 C)  Weight: 152 lb 9.6 oz (69.2 kg)    Fetal Status: Fetal Heart Rate (bpm): 145 Fundal Height: 33 cm Movement: Present  Presentation: Vertex  General:  Alert, oriented and cooperative. Patient is in no acute distress.  Skin: Skin is warm and dry. No rash noted.   Cardiovascular: Normal heart rate noted  Respiratory: Normal respiratory effort, no problems with respiration noted  Abdomen: Soft, gravid, appropriate for gestational age.  Pain/Pressure: Absent     Pelvic: Cervical exam deferred        Extremities: Normal range of motion.  Edema: None  Mental Status: Normal mood and affect. Normal behavior. Normal judgment and thought content.   Assessment and Plan:  Pregnancy: G1P0 at [redacted]w[redacted]d  1. Abnormal glucose tolerance affecting pregnancy, antepartum FH is appropriate currently  2. Susceptible to Varicella (non-immune),  currently pregnant in second trimester Needs varicella pp  3. Supervision of high risk pregnancy, antepartum Up to date Having pelvic and hip pain- given handout on stretches  4. Major depressive disorder, single episode, moderate (HCC) Stable today, defers to present partner at times. Interactive otherwise  5. Illiteracy  6. Vaginal bleeding - evaluated 2/8 for complaint. No active bleeding on exam at that time - Has not had any bleeding since 2/8  Preterm labor symptoms and general obstetric precautions including but not limited to vaginal bleeding, contractions, leaking of fluid and fetal movement were reviewed in detail with the patient. Please refer to After Visit Summary for other counseling recommendations.   Return in about 2 weeks (around 03/18/2019) for Routine prenatal care, in person.  Future Appointments  Date Time Provider Department Center  03/18/2019  8:20 AM [facility] PROVIDER [facility] None    Federico Flake, MD

## 2019-03-04 NOTE — Progress Notes (Signed)
Patient here for MH RV at 33 2/7. Has not yet decided on pediatrician.Burt Knack, RN

## 2019-03-18 ENCOUNTER — Ambulatory Visit: Payer: Self-pay | Admitting: Family Medicine

## 2019-03-18 ENCOUNTER — Other Ambulatory Visit: Payer: Self-pay

## 2019-03-18 ENCOUNTER — Encounter: Payer: Self-pay | Admitting: Family Medicine

## 2019-03-18 VITALS — BP 109/62 | HR 82 | Temp 97.2°F | Wt 154.6 lb

## 2019-03-18 DIAGNOSIS — O0990 Supervision of high risk pregnancy, unspecified, unspecified trimester: Secondary | ICD-10-CM

## 2019-03-18 DIAGNOSIS — F321 Major depressive disorder, single episode, moderate: Secondary | ICD-10-CM

## 2019-03-18 DIAGNOSIS — K219 Gastro-esophageal reflux disease without esophagitis: Secondary | ICD-10-CM

## 2019-03-18 DIAGNOSIS — J45909 Unspecified asthma, uncomplicated: Secondary | ICD-10-CM

## 2019-03-18 NOTE — Progress Notes (Signed)
  PRENATAL VISIT NOTE  Subjective:  Amber Potter is a 19 y.o. G1P0 at [redacted]w[redacted]d being seen today for ongoing prenatal care.  She is currently monitored for the following issues for this low-risk pregnancy and has Major depressive disorder, single episode, moderate (HCC); Asthma; Supervision of high risk pregnancy, antepartum; Susceptible to Varicella (non-immune), currently pregnant in second trimester; Abnormal blood finding 10/09/18 leukocytosis (WBC=12.3, 11.7); Illiteracy; Gastroesophageal reflux in pregnancy; Abnormal 1 hr GTT, 3 hr wnl; and Vaginal bleeding in pregnancy, third trimester on their problem list.  Patient reports pain in bilateral buttocks x1 month. Present on/off, sitting or standing. Radiates to groin. Denies tingling or weakness, just pain.    Contractions: Not present. Vag. Bleeding: None.  Movement: Present. Denies leaking of fluid/ROM.   The following portions of the patient's history were reviewed and updated as appropriate: allergies, current medications, past family history, past medical history, past social history, past surgical history and problem list. Problem list updated.  Objective:   Vitals:   03/18/19 0814  BP: 109/62  Pulse: 82  Temp: (!) 97.2 F (36.2 C)  Weight: 154 lb 9.6 oz (70.1 kg)    Fetal Status: Fetal Heart Rate (bpm): 140 Fundal Height: 34 cm Movement: Present     General:  Alert, oriented and cooperative. Patient is in no acute distress.   Skin: Skin is warm and dry. No rash noted.   Cardiovascular: Normal heart rate noted  Respiratory: Normal respiratory effort, no problems with respiration noted  Abdomen: Soft, gravid, appropriate for gestational age.  Pain/Pressure: Absent     Pelvic: Cervical exam deferred        Extremities: Normal range of motion.  Edema: None Gait normal.   Mental Status: Normal mood and affect. Normal behavior. Normal judgment and thought content.   Assessment and Plan:  Pregnancy: G1P0 at [redacted]w[redacted]d    1.  Supervision of high risk pregnancy, antepartum -Up to date. RTC 1 wk for 36 wk labs.  -Regarding buttock pain: possibly sciatica. Advised pregnancy support belt, tylenol, baths, multiple pillows for back/leg support while laying down. She has handout of back exercises. Advised to RTC if pain worsens or fails to resolve.   2. Gastroesophageal reflux in pregnancy -Pt taking tums with relief. Knows foods to avoid, handout given. If persistent/worsening can add famotidine.   3. Asthma, unspecified asthma severity, unspecified whether complicated, unspecified whether persistent -Has not needed albuterol during pregnancy  4. Major depressive disorder, single episode, moderate (HCC) -States mood is good. Partner is present in room and appears very supportive.   Preterm labor symptoms and general obstetric precautions including but not limited to vaginal bleeding, contractions, leaking of fluid and fetal movement were reviewed in detail with the patient. Please refer to After Visit Summary for other counseling recommendations.  Return in about 1 week (around 03/25/2019) for routine prenatal care.  Future Appointments  Date Time Provider Department Center  03/25/2019  8:40 AM [facility] PROVIDER [facility] None    Ann Held, PA-C

## 2019-03-18 NOTE — Progress Notes (Signed)
Here today for 35.2 week MH RV. Taking PNV QD, denies ED/hospital visits since last RV. Undecided regarding Peds. Has info for local practices. Tawny Hopping, RN

## 2019-03-25 ENCOUNTER — Encounter: Payer: Self-pay | Admitting: Family Medicine

## 2019-03-25 ENCOUNTER — Other Ambulatory Visit: Payer: Self-pay

## 2019-03-25 ENCOUNTER — Ambulatory Visit: Payer: Self-pay | Admitting: Family Medicine

## 2019-03-25 VITALS — BP 111/64 | HR 90 | Temp 98.5°F | Wt 155.0 lb

## 2019-03-25 DIAGNOSIS — F321 Major depressive disorder, single episode, moderate: Secondary | ICD-10-CM

## 2019-03-25 DIAGNOSIS — O09892 Supervision of other high risk pregnancies, second trimester: Secondary | ICD-10-CM

## 2019-03-25 DIAGNOSIS — K219 Gastro-esophageal reflux disease without esophagitis: Secondary | ICD-10-CM

## 2019-03-25 DIAGNOSIS — O0990 Supervision of high risk pregnancy, unspecified, unspecified trimester: Secondary | ICD-10-CM

## 2019-03-25 DIAGNOSIS — J45909 Unspecified asthma, uncomplicated: Secondary | ICD-10-CM

## 2019-03-25 NOTE — Patient Instructions (Signed)
Disfuncin de Manufacturing systems engineer Joint Dysfunction  La disfuncin en la articulacin sacroilaca es un trastorno que causa inflamacin en uno o ambos lados de la articulacin sacroilaca (SI). La articulacin SI conecta la parte inferior de la columna (sacro) con las dos secciones superiores de la pelvis (ilion). Esta afeccin causa un fuerte dolor o dolor urente en la parte inferior de la columna. En algunos casos, el dolor tambin puede extenderse a una o ambas nalgas, la cadera o los muslos. Cules son las causas? Esta afeccin puede ser causada por lo siguiente:  Embarazo. Durante el [place], las articulaciones SI reciben una presin adicional porque la pelvis se ensancha.  Una lesin, por ejemplo: ? Lesiones por accidentes automovilsticos. ? Lesiones relacionadas con deportes. ? Lesiones relacionadas con Leander Rams.  Tener una pierna ms corta que la [place].  Trastornos que [employer], como los siguientes: ? Artritis reumatoide. ? Gota. ? Artritis psorisica. ? Infeccin en las articulaciones (artritis sptica). A veces, se desconoce la causa de la disfuncin en la articulacin SI. Cules son los signos o los sntomas? Los sntomas de esta afeccin incluyen:  Molestias o dolor urente en la parte inferior de la espalda. El dolor tambin puede extenderse a otras reas, como las siguientes: ? Las nalgas. ? La ingle. ? Los muslos.  Espasmos musculares en las reas doloridas o alrededor de ellas.  Aumento del dolor al estar de pie, caminar, correr, subir escaleras, agacharse o levantarse. Cmo se diagnostica? Esta afeccin se diagnostica mediante un examen fsico y los antecedentes mdicos. Durante el examen, el mdico puede moverle una o ambas piernas a diferentes posiciones para constatar si le duele. Pueden realizarse diversos estudios para confirmar el diagnstico, entre ellos:  Pruebas de diagnstico por imgenes para buscar otras causas  del dolor. Estas pruebas pueden incluir lo siguiente: ? Resonancia magntica (RM). ? Exploracin por tomografa computarizada (TC). ? Steele sea.  Inyeccin de diagnstico. Con una aguja, se inyecta un anestsico en la articulacin SI. Si despus de Copywriter, advertising se detiene o se interrumpe de forma temporal, esto puede indicar que el problema es una disfuncin en la articulacin SI. Cmo se trata? El tratamiento depende de la causa y de la gravedad de Engineer, manufacturing systems. Las opciones de tratamiento pueden incluir:  Midwife hielo o calor en la zona lumbar despus de una lesin. Esto puede ayudar a Dietitian y los espasmos musculares.  Medicamentos para Best boy o la inflamacin, o para [employer].  Uso de un soporte en la espalda (soporte sacroilaco) como ayuda para dar sostn a la articulacin mientras la espalda mejora.  Fisioterapia para aumentar la fuerza muscular alrededor de la articulacin y la flexibilidad de Water engineer. Esta tambin [employer] dirigida a aprender posturas corporales adecuadas y formas de moverse para Human resources officer sobrecarga en la articulacin.  Manipulacin directa de la articulacin SI.  Inyecciones de medicamentos con corticoesteroides en la articulacin para Best boy y reducir la hinchazn.  Ablacin por radiofrecuencia para destruir los nervios que llevan mensajes de dolor desde la articulacin.  Uso de un dispositivo que emite estimulacin elctrica para ayudar a Geophysical data processor.  Ciruga para Glass blower/designer tornillos y placas que limitan o evitan el movimiento de Water engineer. Esto es poco frecuente. Siga estas instrucciones en su casa: Medicamentos  [employer] de venta libre y los recetados solamente como se lo haya indicado el mdico.  No conduzca ni use [place]  pesada mientras toma analgsicos recetados.  Si toma analgsicos recetados, adopte medidas para prevenir o tratar el  estreimiento. El mdico podra recomendarle que haga lo siguiente: ? Beber suficiente lquido como para mantener la orina de color amarillo plido. ? Consumir alimentos ricos en fibra, como frutas y verduras frescas, cereales integrales y frijoles. ? Limitar el consumo de alimentos ricos en grasa y azcares procesados, como los alimentos fritos o dulces. ? Tomar un medicamento recetado o de venta libre para el estreimiento. Si tiene un dispositivo ortopdico:  selo como se lo haya indicado el mdico. Quteselo solamente como se lo haya indicado el mdico.  Mantenga limpio el dispositivo ortopdico.  Si el dispositivo ortopdico no es impermeable: ? No deje que se moje. ? Cbralo con un envoltorio hermtico cuando tome un bao de inmersin o [place]. Control del dolor, la rigidez y la hinchazn      El hielo puede aliviar el dolor y reducir la hinchazn. El calor puede aliviar la tensin muscular o los espasmos musculares. Pregntele al mdico si debe usar hielo o calor.  Si se lo indican, aplique hielo sobre la zona afectada: ? Si [place] un dispositivo ortopdico desmontable, quteselo segn las indicaciones del mdico. ? Ponga el hielo en una bolsa plstica. ? Coloque una [employer] piel y Copy. ? Coloque el hielo durante , 2 a 3veces por da.  Si se lo indican, aplique calor en la zona afectada. Use la fuente de calor que el mdico le recomiende, como una compresa de calor hmedo o una almohadilla trmica. ? Coloque una [employer] piel y la fuente de Airline pilot. ? Aplique calor durante 20 a . ? Retire la fuente de calor si la piel se pone de color rojo brillante. Esto es especialmente importante si no puede sentir dolor, calor o fro. Puede correr un riesgo mayor de sufrir quemaduras. Instrucciones generales  Descanse todo lo que sea necesario. Pregntele al mdico qu actividades son seguras para usted.  Retome sus actividades normales segn lo  indicado por el mdico.  Haga ejercicio como se lo haya indicado el fisioterapeuta o su mdico.  No consuma ningn producto que contenga nicotina o tabaco, como cigarrillos y Administrator, Civil Service. Estos pueden retrasar la consolidacin del [place]. Si necesita ayuda para dejar de fumar, consulte al mdico.  Concurra a todas las visitas de seguimiento como se lo haya indicado el mdico. Esto es importante. Comunquese con un mdico si:  El dolor no se alivia con los [employer].  Tiene fiebre.  El dolor [place]. Solicite ayuda inmediatamente si:  Siente debilidad, adormecimiento u hormigueo en los pies o en las piernas.  Pierde el control de la vejiga o del intestino. Resumen  La disfuncin en la articulacin sacroilaca es un trastorno que causa inflamacin en uno o ambos lados de la articulacin sacroilaca (SI).  Esta afeccin causa un fuerte dolor o dolor urente en la parte inferior de la columna. En algunos casos, el dolor tambin puede extenderse a una o ambas nalgas, la cadera o los muslos.  El tratamiento depende de la causa y de la gravedad de Astronomer. Puede incluir medicamentos para reducir Chief Technology Officer y la hinchazn, o para relajar los msculos. Esta informacin no tiene Theme park manager el consejo del mdico. Asegrese de hacerle al mdico cualquier pregunta que tenga. Document Revised: 09/26/2017 Document Reviewed: 09/26/2017 Elsevier Patient Education  2020 [employer].

## 2019-03-25 NOTE — Progress Notes (Signed)
PRENATAL VISIT NOTE  Subjective:  Amber Potter is a 19 y.o. G1P0 at [redacted]w[redacted]d being seen today for ongoing prenatal care.  She is currently monitored for the following issues for this low-risk pregnancy and has Major depressive disorder, single episode, moderate (HCC); Asthma; Supervision of high risk pregnancy, antepartum; Susceptible to Varicella (non-immune), currently pregnant in second trimester; Abnormal blood finding 10/09/18 leukocytosis (WBC=12.3, 11.7); Illiteracy; Gastroesophageal reflux in pregnancy; Abnormal 1 hr GTT, 3 hr wnl; and Vaginal bleeding in pregnancy, third trimester on their problem list.  Patient reports backache.  Contractions: Not present. Vag. Bleeding: None.  Movement: Present. Denies leaking of fluid/ROM.   The following portions of the patient's history were reviewed and updated as appropriate: allergies, current medications, past family history, past medical history, past social history, past surgical history and problem list. Problem list updated.  Objective:   Vitals:   03/25/19 0832  BP: 111/64  Pulse: 90  Temp: 98.5 F (36.9 C)  Weight: 155 lb (70.3 kg)    Fetal Status: Fetal Heart Rate (bpm): 150 Fundal Height: 34 cm Movement: Present  Presentation: Vertex  General:  Alert, oriented and cooperative. Patient is in no acute distress.  Skin: Skin is warm and dry. No rash noted.   Cardiovascular: Normal heart rate noted  Respiratory: Normal respiratory effort, no problems with respiration noted  Abdomen: Soft, gravid, appropriate for gestational age.  Pain/Pressure: Absent     Pelvic: Cervical exam deferred        Extremities: Normal range of motion.  Edema: None. No tenderness, erythema or abnormality of spine/back/SI joints.   Mental Status: Normal mood and affect. Normal behavior. Normal judgment and thought content.   Assessment and Plan:  Pregnancy: G1P0 at [redacted]w[redacted]d    1. Supervision of high risk pregnancy, antepartum -36 wk labs today.  Discussed q 1 wk visits from now on.  -Back/groin pain continues. As she endorses pain when rolling out of bed some of it might be related to SI joint, condition and exercises discussed. Advised continued use of pregnancy belt, tylenol, baths, rest.  - Chlamydia/GC NAA, Confirmation - Culture, beta strep (group b only)  2. Gastroesophageal reflux in pregnancy -Persists. Advised to try famotidine 10mg  BID. She already takes tums and has made dietary changes.   3. Asthma, unspecified asthma severity, unspecified whether complicated, unspecified whether persistent -Has not needed inhaler.  4. Major depressive disorder, single episode, moderate (HCC) -Phq-9 score 8. Has difficulty sleeping, low energy, low appetite, and little interest in doing things. Once again offered [facility] referral but she states mood is ok and she declines.  5. Susceptible to Varicella (non-immune), currently pregnant in second trimester -Needs pp vaccination   Preterm labor symptoms and general obstetric precautions including but not limited to vaginal bleeding, contractions, leaking of fluid and fetal movement were reviewed in detail with the patient. Please refer to After Visit Summary for other counseling recommendations.  Return in about 1 week (around 04/01/2019) for routine prenatal care.  No future appointments.  04/03/2019, PA-C

## 2019-03-25 NOTE — Progress Notes (Signed)
Patient here for MH RV at 36 2/7. 36 week labs and packet given today. Provider to collect labs today.Burt Knack, RN

## 2019-03-27 LAB — CHLAMYDIA/GC NAA, CONFIRMATION
Chlamydia trachomatis, NAA: NEGATIVE
Neisseria gonorrhoeae, NAA: NEGATIVE

## 2019-03-29 LAB — CULTURE, BETA STREP (GROUP B ONLY): Strep Gp B Culture: POSITIVE — AB

## 2019-03-31 DIAGNOSIS — O99820 Streptococcus B carrier state complicating pregnancy: Secondary | ICD-10-CM | POA: Insufficient documentation

## 2019-04-01 ENCOUNTER — Telehealth: Payer: Self-pay

## 2019-04-01 ENCOUNTER — Encounter: Payer: Self-pay | Admitting: Family Medicine

## 2019-04-01 ENCOUNTER — Other Ambulatory Visit: Payer: Self-pay

## 2019-04-01 ENCOUNTER — Ambulatory Visit: Payer: Self-pay | Admitting: Family Medicine

## 2019-04-01 VITALS — BP 99/60 | HR 79 | Temp 97.1°F | Wt 155.6 lb

## 2019-04-01 DIAGNOSIS — Z2839 Other underimmunization status: Secondary | ICD-10-CM

## 2019-04-01 DIAGNOSIS — O0990 Supervision of high risk pregnancy, unspecified, unspecified trimester: Secondary | ICD-10-CM

## 2019-04-01 DIAGNOSIS — K219 Gastro-esophageal reflux disease without esophagitis: Secondary | ICD-10-CM

## 2019-04-01 DIAGNOSIS — O26843 Uterine size-date discrepancy, third trimester: Secondary | ICD-10-CM

## 2019-04-01 DIAGNOSIS — F321 Major depressive disorder, single episode, moderate: Secondary | ICD-10-CM

## 2019-04-01 DIAGNOSIS — B951 Streptococcus, group B, as the cause of diseases classified elsewhere: Secondary | ICD-10-CM

## 2019-04-01 DIAGNOSIS — O09892 Supervision of other high risk pregnancies, second trimester: Secondary | ICD-10-CM

## 2019-04-01 DIAGNOSIS — O99619 Diseases of the digestive system complicating pregnancy, unspecified trimester: Secondary | ICD-10-CM

## 2019-04-01 MED ORDER — PRENATAL VITAMINS 28-0.8 MG PO TABS: 1.0000 | ORAL_TABLET | Freq: Every day | ORAL | 0 refills | Status: AC

## 2019-04-01 NOTE — Progress Notes (Signed)
Here today for 37.2 week MH RV. Taking PNV QD, denies ED/hospital visit. +GBS counseling and info today. Tawny Hopping, RN

## 2019-04-01 NOTE — Progress Notes (Signed)
  PRENATAL VISIT NOTE  Subjective:  Amber Potter is a 19 y.o. G1P0 at [redacted]w[redacted]d being seen today for ongoing prenatal care.  She is currently monitored for the following issues for this low-risk pregnancy and has Major depressive disorder, single episode, moderate (HCC); Asthma; Supervision of high risk pregnancy, antepartum; Susceptible to Varicella (non-immune), currently pregnant in second trimester; Abnormal blood finding 10/09/18 leukocytosis (WBC=12.3, 11.7); Illiteracy; Gastroesophageal reflux in pregnancy; Abnormal 1 hr GTT, 3 hr wnl; Vaginal bleeding in pregnancy, third trimester; Group beta Strep positive; and Uterine size-date discrepancy in third trimester on their problem list.  Patient reports no complaints.  Contractions: Irregular. Vag. Bleeding: None.  Movement: Present. Denies leaking of fluid/ROM.   Mood: states she is doing good  GERD: famotidine is helping   The following portions of the patient's history were reviewed and updated as appropriate: allergies, current medications, past family history, past medical history, past social history, past surgical history and problem list. Problem list updated.  Objective:   Vitals:   04/01/19 0817  BP: 99/60  Pulse: 79  Temp: (!) 97.1 F (36.2 C)  Weight: 155 lb 9.6 oz (70.6 kg)    Fetal Status: Fetal Heart Rate (bpm): 140 Fundal Height: 34 cm Movement: Present  Presentation: Vertex  General:  Alert, oriented and cooperative. Patient is in no acute distress.  Skin: Skin is warm and dry. No rash noted.   Cardiovascular: Normal heart rate noted  Respiratory: Normal respiratory effort, no problems with respiration noted  Abdomen: Soft, gravid, appropriate for gestational age.  Pain/Pressure: Absent     Pelvic: Cervical exam deferred        Extremities: Normal range of motion.  Edema: None  Mental Status: Normal mood and affect. Normal behavior. Normal judgment and thought content.   Assessment and Plan:  Pregnancy:  G1P0 at [redacted]w[redacted]d  1. Supervision of high risk pregnancy, antepartum -Up to date. Reviewed 36 wk labs from last visit. Reviewed when to go to hospital.  2. Uterine size-date discrepancy in third trimester -Size < dates. Pt denies leaking of fluid/ROM. Referred for [place] through [facility] today.  3. Group beta Strep positive -Discussed GBS+ result, need for intrapartum antibiotics and to alert hospital staff upon arrival of GBS+ diagnosis.   4. Gastroesophageal reflux in pregnancy -Famotidine is helping, advised to continue.   5. Major depressive disorder, single episode, moderate (HCC) -States mood is good.  6. Susceptible to Varicella (non-immune), currently pregnant in second trimester -Needs pp vaccination.   Preterm labor symptoms and general obstetric precautions including but not limited to vaginal bleeding, contractions, leaking of fluid and fetal movement were reviewed in detail with the patient. Please refer to After Visit Summary for other counseling recommendations.  Return in about 1 week (around 04/08/2019) for routine prenatal care.  Future Appointments  Date Time Provider Department Center  04/08/2019  8:20 AM [facility] PROVIDER [facility] None    Ann Held, PA-C

## 2019-04-01 NOTE — Progress Notes (Signed)
PNV's given. Explained to patient that provider is ordering an ultrasound at [facility] and that she will need to pay a $50.00 deposit and be billed for the remainder of the balance. Patient agreeable to above. Tawny Hopping, RN

## 2019-04-01 NOTE — Telephone Encounter (Signed)
Phone call to patient to inform of scheduled [facility] [place] appt. For 04/04/2019 @ 1:30 (to arrive at 1:15.) Language Line interpreter [phone] utilized for above. Patient verbalized understanding of appt time, date and location as well as to take a $50.00 deposit to appt. And will be billed for the remainder of the cost. Tawny Hopping, RN

## 2019-04-08 ENCOUNTER — Other Ambulatory Visit: Payer: Self-pay

## 2019-04-08 ENCOUNTER — Ambulatory Visit: Payer: Self-pay | Admitting: Family Medicine

## 2019-04-08 VITALS — BP 97/64 | HR 74 | Temp 97.2°F | Wt 160.0 lb

## 2019-04-08 DIAGNOSIS — Z283 Underimmunization status: Secondary | ICD-10-CM

## 2019-04-08 DIAGNOSIS — O99820 Streptococcus B carrier state complicating pregnancy: Secondary | ICD-10-CM

## 2019-04-08 DIAGNOSIS — O99810 Abnormal glucose complicating pregnancy: Secondary | ICD-10-CM

## 2019-04-08 DIAGNOSIS — O09892 Supervision of other high risk pregnancies, second trimester: Secondary | ICD-10-CM

## 2019-04-08 DIAGNOSIS — O26843 Uterine size-date discrepancy, third trimester: Secondary | ICD-10-CM

## 2019-04-08 DIAGNOSIS — O0990 Supervision of high risk pregnancy, unspecified, unspecified trimester: Secondary | ICD-10-CM

## 2019-04-08 NOTE — Progress Notes (Addendum)
Here today for 38.2 week MH RV. Taking PNV QD. Denies ED/hospital visits since last RV. Kept 04/04/19 scheduled [facility] [place]. Tawny Hopping, RN

## 2019-04-08 NOTE — Progress Notes (Signed)
PRENATAL VISIT NOTE  Subjective:  Aleksis Jiggetts is a 19 y.o. G1P0 at [redacted]w[redacted]d being seen today for ongoing prenatal care.  She is currently monitored for the following issues for this low-risk pregnancy and has Major depressive disorder, single episode, moderate (HCC); Asthma; Supervision of high risk pregnancy, antepartum; Susceptible to Varicella (non-immune), currently pregnant in second trimester; Abnormal blood finding 10/09/18 leukocytosis (WBC=12.3, 11.7); Illiteracy; Gastroesophageal reflux in pregnancy; Abnormal 1 hr GTT, 3 hr wnl; Vaginal bleeding in pregnancy, third trimester; Group beta Strep positive; and Uterine size-date discrepancy in third trimester on their problem list.  Patient reports no complaints.   .  .   . Denies leaking of fluid/ROM.   The following portions of the patient's history were reviewed and updated as appropriate: allergies, current medications, past family history, past medical history, past social history, past surgical history and problem list. Problem list updated.  Objective:  There were no vitals filed for this visit.  Fetal Status:           General:  Alert, oriented and cooperative. Patient is in no acute distress.  Skin: Skin is warm and dry. No rash noted.   Cardiovascular: Normal heart rate noted  Respiratory: Normal respiratory effort, no problems with respiration noted  Abdomen: Soft, gravid, appropriate for gestational age.        Pelvic: Cervical exam deferred        Extremities: Normal range of motion.     Mental Status: Normal mood and affect. Normal behavior. Normal judgment and thought content.   Assessment and Plan:  Pregnancy: G1P0 at [redacted]w[redacted]d  1. Group beta Strep positive Counseled by RN and problem list updated  2. Abnormal glucose tolerance affecting pregnancy, antepartum 3hr wnl and recent growth US showed normal growth  3. Susceptible to Varicella (non-immune), currently pregnant in second trimester Recommend varicella  vaccine after delivery in hospital or at postpartum  4. Supervision of high risk pregnancy, antepartum Up to date  TWG=14 lb (6.35 kg) is on the low end but has had appropriate increase in the 3rd trimester.  Reviewed that we will plan for IOL at 41 wks and usually request this appt at her next visit at 39 wks.   5. Uterine size-date discrepancy in third trimester Normal growth [place] at [facility] on 3/19-- showed EFW 3292g, 54th% and AFI WNL at 16.94cm Reviewed results with client and her partner.    Term labor symptoms and general obstetric precautions including but not limited to vaginal bleeding, contractions, leaking of fluid and fetal movement were reviewed in detail with the patient. Please refer to After Visit Summary for other counseling recommendations.  No follow-ups on file.  No future appointments.  Federico Flake, MD

## 2019-04-15 ENCOUNTER — Other Ambulatory Visit: Payer: Self-pay

## 2019-04-15 ENCOUNTER — Encounter: Payer: Self-pay | Admitting: Family Medicine

## 2019-04-15 ENCOUNTER — Ambulatory Visit: Payer: Self-pay | Admitting: Family Medicine

## 2019-04-15 VITALS — BP 104/69 | HR 74 | Temp 97.9°F | Wt 159.0 lb

## 2019-04-15 DIAGNOSIS — O0990 Supervision of high risk pregnancy, unspecified, unspecified trimester: Secondary | ICD-10-CM

## 2019-04-15 DIAGNOSIS — O99820 Streptococcus B carrier state complicating pregnancy: Secondary | ICD-10-CM

## 2019-04-15 DIAGNOSIS — O09892 Supervision of other high risk pregnancies, second trimester: Secondary | ICD-10-CM

## 2019-04-15 DIAGNOSIS — F321 Major depressive disorder, single episode, moderate: Secondary | ICD-10-CM

## 2019-04-15 NOTE — Progress Notes (Signed)
Patient here for MH RV at 39 2/7.Marland KitchenMarland KitchenBurt Knack, RN

## 2019-04-15 NOTE — Progress Notes (Signed)
  PRENATAL VISIT NOTE  Subjective:  Amber Potter is a 19 y.o. G1P0 at [redacted]w[redacted]d being seen today for ongoing prenatal care.  She is currently monitored for the following issues for this low-risk pregnancy and has Major depressive disorder, single episode, moderate (HCC); Asthma; Supervision of high risk pregnancy, antepartum; Susceptible to Varicella (non-immune), currently pregnant in second trimester; Abnormal blood finding 10/09/18 leukocytosis (WBC=12.3, 11.7); Illiteracy; Gastroesophageal reflux in pregnancy; Abnormal 1 hr GTT, 3 hr wnl; Vaginal bleeding in pregnancy, third trimester; Group B Streptococcus carrier, +RV culture, currently pregnant; and Uterine size-date discrepancy in third trimester on their problem list.  Patient reports backache since Sunday. Also had irregular contractions x30 minutes on Sunday, none since.    Contractions: Irregular. Vag. Bleeding: None.  Movement: Present. Denies leaking of fluid/ROM.   The following portions of the patient's history were reviewed and updated as appropriate: allergies, current medications, past family history, past medical history, past social history, past surgical history and problem list. Problem list updated.  Objective:   Vitals:   04/15/19 1021  BP: 104/69  Pulse: 74  Temp: 97.9 F (36.6 C)  Weight: 159 lb (72.1 kg)    Fetal Status: Fetal Heart Rate (bpm): 135 Fundal Height: 38 cm Movement: Present  Presentation: Vertex  General:  Alert, oriented and cooperative. Patient is in no acute distress.  Skin: Skin is warm and dry. No rash noted.   Cardiovascular: Normal heart rate noted  Respiratory: Normal respiratory effort, no problems with respiration noted  Abdomen: Soft, gravid, appropriate for gestational age.  Pain/Pressure: Absent     Pelvic: Cervical exam performed Dilation: 1 Effacement (%): 50 Station: -3  Extremities: Normal range of motion.  Edema: None  Mental Status: Normal mood and affect. Normal behavior.  Normal judgment and thought content.   Assessment and Plan:  Pregnancy: G1P0 at [redacted]w[redacted]d   1. Supervision of high risk pregnancy, antepartum -IOL completed today for induction at [redacted]w[redacted]d. -Pt is ready for baby at home, knows when to go to L&D -Plans for depo pp contraception.  2. Group B Streptococcus carrier, +RV culture, currently pregnant -Pt aware to inform L&D  3. Susceptible to Varicella (non-immune), currently pregnant in second trimester -Needs pp vaccination  4. Major depressive disorder, single episode, moderate (HCC) -Pt states she emotionally is doing ok today.   Term labor symptoms and general obstetric precautions including but not limited to vaginal bleeding, contractions, leaking of fluid and fetal movement were reviewed in detail with the patient. Please refer to After Visit Summary for other counseling recommendations.  Return in about 1 week (around 04/22/2019) for routine prenatal care.  Future Appointments  Date Time Provider Department Center  04/23/2019 10:20 AM [facility] PROVIDER [facility] None    Ann Held, PA-C

## 2019-04-15 NOTE — Progress Notes (Signed)
[facility] IOL referral faxed with confirmation..Dequan Kindred Brewer-Jensen, RN  ?

## 2019-04-16 ENCOUNTER — Other Ambulatory Visit: Payer: Self-pay | Admitting: Obstetrics and Gynecology

## 2019-04-16 NOTE — Progress Notes (Signed)
OB History & Physical   History of Present Illness:  Chief Complaint:   Scheduled Late term IOL at 41+0wks  Pregnancy Issues: 1. Varicella Non-immune 2. Spanish speaking, limited literacy 3. Depression, no meds, declined counseling 4. Elevated 1hr GTT, normal 3hr GTT   Maternal Medical History:   Past Medical History:  Diagnosis Date  . Depression    No meds  . History of asthma    Has inhaler--not used in months    No past surgical history on file.  No Known Allergies  Prior to Admission medications   Medication Sig Start Date End Date Taking? Authorizing Provider  Prenatal Vit-Fe Fumarate-FA (PRENATAL VITAMINS) 28-0.8 MG TABS Take 1 tablet by mouth daily for 100 doses. 04/01/19 07/10/19  Federico Flake, MD     Prenatal care site: [facility]    Pertinent Results:  Prenatal Labs: Blood type/Rh  O Pos  Antibody screen neg  Rubella Immune  Varicella Immune  RPR NR  HBsAg Neg  HIV NR  GC neg  Chlamydia neg  Genetic screening Negative Quad screen 11/14/18  1 hour GTT 139  3 hour GTT  [id number]  GBS  Positive   Tdap: 01/22/19 Flu: 12/11/18 Infant feeding: breast Contraception: Depo  Randa Ngo, CNM 04/16/19 1:53 PM

## 2019-04-22 ENCOUNTER — Inpatient Hospital Stay
Admission: EM | Admit: 2019-04-22 | Discharge: 2019-04-25 | DRG: 806 | Disposition: A | Discharge status: Home / Self Care | Payer: Medicaid Other | Attending: Certified Nurse Midwife | Admitting: Certified Nurse Midwife

## 2019-04-22 ENCOUNTER — Other Ambulatory Visit: Payer: Self-pay

## 2019-04-22 ENCOUNTER — Encounter: Payer: Self-pay | Admitting: Obstetrics and Gynecology

## 2019-04-22 DIAGNOSIS — D62 Acute posthemorrhagic anemia: Secondary | ICD-10-CM | POA: Diagnosis not present

## 2019-04-22 DIAGNOSIS — O480 Post-term pregnancy: Principal | ICD-10-CM | POA: Diagnosis present

## 2019-04-22 DIAGNOSIS — Z20822 Contact with and (suspected) exposure to covid-19: Secondary | ICD-10-CM | POA: Diagnosis present

## 2019-04-22 DIAGNOSIS — O09892 Supervision of other high risk pregnancies, second trimester: Secondary | ICD-10-CM

## 2019-04-22 DIAGNOSIS — J45909 Unspecified asthma, uncomplicated: Secondary | ICD-10-CM | POA: Diagnosis present

## 2019-04-22 DIAGNOSIS — Z3A40 40 weeks gestation of pregnancy: Secondary | ICD-10-CM

## 2019-04-22 DIAGNOSIS — Z2839 Other underimmunization status: Secondary | ICD-10-CM

## 2019-04-22 DIAGNOSIS — O9952 Diseases of the respiratory system complicating childbirth: Secondary | ICD-10-CM | POA: Diagnosis present

## 2019-04-22 DIAGNOSIS — O99824 Streptococcus B carrier state complicating childbirth: Secondary | ICD-10-CM | POA: Diagnosis present

## 2019-04-22 DIAGNOSIS — O99820 Streptococcus B carrier state complicating pregnancy: Secondary | ICD-10-CM

## 2019-04-22 DIAGNOSIS — O9081 Anemia of the puerperium: Secondary | ICD-10-CM | POA: Diagnosis not present

## 2019-04-22 DIAGNOSIS — O99810 Abnormal glucose complicating pregnancy: Secondary | ICD-10-CM

## 2019-04-22 NOTE — OB Triage Note (Signed)
Pt presents to unit c/o ctx that began earlier today around 1600 and have progressively gotten stronger. Pt reports +FM, denies vaginal bleeding and LOF. Pt had scheduled IOL for postdates on 04/27/19. Initial FHTs 130s. Will continue to monitor.

## 2019-04-23 ENCOUNTER — Encounter: Payer: Self-pay | Admitting: Obstetrics and Gynecology

## 2019-04-23 ENCOUNTER — Inpatient Hospital Stay: Payer: Medicaid Other | Admitting: Registered Nurse

## 2019-04-23 ENCOUNTER — Ambulatory Visit: Payer: Self-pay

## 2019-04-23 DIAGNOSIS — O99824 Streptococcus B carrier state complicating childbirth: Secondary | ICD-10-CM | POA: Diagnosis present

## 2019-04-23 DIAGNOSIS — O9081 Anemia of the puerperium: Secondary | ICD-10-CM | POA: Diagnosis not present

## 2019-04-23 DIAGNOSIS — O26893 Other specified pregnancy related conditions, third trimester: Secondary | ICD-10-CM | POA: Diagnosis present

## 2019-04-23 DIAGNOSIS — Z20822 Contact with and (suspected) exposure to covid-19: Secondary | ICD-10-CM | POA: Diagnosis present

## 2019-04-23 DIAGNOSIS — Z3A40 40 weeks gestation of pregnancy: Secondary | ICD-10-CM | POA: Diagnosis not present

## 2019-04-23 DIAGNOSIS — J45909 Unspecified asthma, uncomplicated: Secondary | ICD-10-CM | POA: Diagnosis present

## 2019-04-23 DIAGNOSIS — D62 Acute posthemorrhagic anemia: Secondary | ICD-10-CM | POA: Diagnosis not present

## 2019-04-23 DIAGNOSIS — O9952 Diseases of the respiratory system complicating childbirth: Secondary | ICD-10-CM | POA: Diagnosis present

## 2019-04-23 DIAGNOSIS — O480 Post-term pregnancy: Secondary | ICD-10-CM | POA: Diagnosis present

## 2019-04-23 LAB — PROTEIN / CREATININE RATIO, URINE
Creatinine, Urine: 70 mg/dL
Protein Creatinine Ratio: 0.17 mg/mg{Cre} — ABNORMAL HIGH (ref 0.00–0.15)
Total Protein, Urine: 12 mg/dL

## 2019-04-23 LAB — COMPREHENSIVE METABOLIC PANEL
ALT: 13 U/L (ref 0–44)
AST: 26 U/L (ref 15–41)
Albumin: 2.9 g/dL — ABNORMAL LOW (ref 3.5–5.0)
Alkaline Phosphatase: 169 U/L — ABNORMAL HIGH (ref 38–126)
Anion gap: 10 (ref 5–15)
BUN: 9 mg/dL (ref 6–20)
CO2: 19 mmol/L — ABNORMAL LOW (ref 22–32)
Calcium: 9 mg/dL (ref 8.9–10.3)
Chloride: 108 mmol/L (ref 98–111)
Creatinine, Ser: 0.66 mg/dL (ref 0.44–1.00)
GFR calc Af Amer: >60 mL/min (ref >60)
GFR calc non Af Amer: >60 mL/min (ref >60)
Glucose, Bld: 98 mg/dL (ref 70–99)
Potassium: 4 mmol/L (ref 3.5–5.1)
Sodium: 137 mmol/L (ref 135–145)
Total Bilirubin: 0.5 mg/dL (ref 0.3–1.2)
Total Protein: 6.6 g/dL (ref 6.5–8.1)

## 2019-04-23 LAB — RESPIRATORY PANEL BY RT PCR (FLU A&B, COVID)
Influenza A by PCR: NEGATIVE
Influenza B by PCR: NEGATIVE
SARS Coronavirus 2 by RT PCR: NEGATIVE

## 2019-04-23 LAB — CBC
HCT: 35.6 % — ABNORMAL LOW (ref 36.0–46.0)
Hemoglobin: 11.4 g/dL — ABNORMAL LOW (ref 12.0–15.0)
MCH: 27.3 pg (ref 26.0–34.0)
MCHC: 32 g/dL (ref 30.0–36.0)
MCV: 85.2 fL (ref 80.0–100.0)
Platelets: 254 10*3/uL (ref 150–400)
RBC: 4.18 MIL/uL (ref 3.87–5.11)
RDW: 14.4 % (ref 11.5–15.5)
WBC: 12.5 10*3/uL — ABNORMAL HIGH (ref 4.0–10.5)
nRBC: 0 % (ref 0.0–0.2)

## 2019-04-23 LAB — TYPE AND SCREEN
ABO/RH(D): O POS
Antibody Screen: NEGATIVE

## 2019-04-23 LAB — ABO/RH: ABO/RH(D): O POS

## 2019-04-23 LAB — RPR: RPR Ser Ql: NONREACTIVE

## 2019-04-23 MED ORDER — OXYTOCIN 40 UNITS IN NORMAL SALINE INFUSION - SIMPLE MED: 2.5000 [IU]/h | INTRAVENOUS | Status: DC

## 2019-04-23 MED ORDER — PHENYLEPHRINE 40 MCG/ML (10ML) SYRINGE FOR IV PUSH (FOR BLOOD PRESSURE SUPPORT): 80.0000 ug | PREFILLED_SYRINGE | INTRAVENOUS | Status: DC | PRN

## 2019-04-23 MED ORDER — MISOPROSTOL 200 MCG PO TABS
ORAL_TABLET | ORAL | Status: AC
  Filled 2019-04-23: qty 4

## 2019-04-23 MED ORDER — OXYTOCIN 40 UNITS IN NORMAL SALINE INFUSION - SIMPLE MED
INTRAVENOUS | Status: AC
  Filled 2019-04-23: qty 1000

## 2019-04-23 MED ORDER — FENTANYL CITRATE (PF) 100 MCG/2ML IJ SOLN: 50.0000 ug | INTRAMUSCULAR | Status: DC | PRN

## 2019-04-23 MED ORDER — SOD CITRATE-CITRIC ACID 500-334 MG/5ML PO SOLN: 30.0000 mL | ORAL | Status: DC | PRN

## 2019-04-23 MED ORDER — DIPHENHYDRAMINE HCL 25 MG PO CAPS: 25.0000 mg | ORAL_CAPSULE | Freq: Four times a day (QID) | ORAL | Status: DC | PRN

## 2019-04-23 MED ORDER — SIMETHICONE 80 MG PO CHEW: 80.0000 mg | CHEWABLE_TABLET | ORAL | Status: DC | PRN

## 2019-04-23 MED ORDER — ONDANSETRON HCL 4 MG/2ML IJ SOLN: 4.0000 mg | Freq: Four times a day (QID) | INTRAMUSCULAR | Status: DC | PRN

## 2019-04-23 MED ORDER — OXYTOCIN 10 UNIT/ML IJ SOLN
INTRAMUSCULAR | Status: AC
  Filled 2019-04-23: qty 2

## 2019-04-23 MED ORDER — ONDANSETRON HCL 4 MG/2ML IJ SOLN: 4.0000 mg | INTRAMUSCULAR | Status: DC | PRN

## 2019-04-23 MED ORDER — LACTATED RINGERS IV SOLN: 500.0000 mL | Freq: Once | INTRAVENOUS | Status: AC

## 2019-04-23 MED ORDER — PRENATAL MULTIVITAMIN CH: 1.0000 | ORAL_TABLET | Freq: Every day | ORAL | Status: DC

## 2019-04-23 MED ORDER — OXYTOCIN BOLUS FROM INFUSION
500.0000 mL | Freq: Once | INTRAVENOUS | Status: AC
  Administered 2019-04-23: 500 mL via INTRAVENOUS

## 2019-04-23 MED ORDER — ACETAMINOPHEN 325 MG PO TABS: 650.0000 mg | ORAL_TABLET | ORAL | Status: DC | PRN

## 2019-04-23 MED ORDER — LIDOCAINE HCL (PF) 1 % IJ SOLN
INTRAMUSCULAR | Status: AC
  Filled 2019-04-23: qty 30

## 2019-04-23 MED ORDER — WITCH HAZEL-GLYCERIN EX PADS
1.0000 "application " | MEDICATED_PAD | CUTANEOUS | Status: DC
  Filled 2019-04-23: qty 100

## 2019-04-23 MED ORDER — DIPHENHYDRAMINE HCL 50 MG/ML IJ SOLN: 12.5000 mg | INTRAMUSCULAR | Status: DC | PRN

## 2019-04-23 MED ORDER — BENZOCAINE-MENTHOL 20-0.5 % EX AERO
1.0000 "application " | INHALATION_SPRAY | CUTANEOUS | Status: DC | PRN
  Filled 2019-04-23: qty 56

## 2019-04-23 MED ORDER — EPHEDRINE 5 MG/ML INJ: 10.0000 mg | INTRAVENOUS | Status: DC | PRN

## 2019-04-23 MED ORDER — OXYTOCIN 40 UNITS IN NORMAL SALINE INFUSION - SIMPLE MED
1.0000 m[IU]/min | INTRAVENOUS | Status: DC
  Administered 2019-04-23: 2 m[IU]/min via INTRAVENOUS

## 2019-04-23 MED ORDER — FENTANYL 2.5 MCG/ML W/ROPIVACAINE 0.15% IN NS 100 ML EPIDURAL (ARMC): 12.0000 mL/h | EPIDURAL | Status: DC

## 2019-04-23 MED ORDER — LIDOCAINE-EPINEPHRINE (PF) 1.5 %-1:200000 IJ SOLN
INTRAMUSCULAR | Status: DC | PRN
  Administered 2019-04-23: 3 mL via EPIDURAL

## 2019-04-23 MED ORDER — PENICILLIN G 3 MILLION UNITS IVPB - SIMPLE MED
3.0000 10*6.[IU] | INTRAVENOUS | Status: DC
  Administered 2019-04-23 (×3): 3 10*6.[IU] via INTRAVENOUS
  Filled 2019-04-23 (×3): qty 100

## 2019-04-23 MED ORDER — LIDOCAINE HCL (PF) 1 % IJ SOLN
INTRAMUSCULAR | Status: DC | PRN
  Administered 2019-04-23: 3 mL via SUBCUTANEOUS

## 2019-04-23 MED ORDER — ONDANSETRON HCL 4 MG PO TABS: 4.0000 mg | ORAL_TABLET | ORAL | Status: DC | PRN

## 2019-04-23 MED ORDER — FENTANYL 2.5 MCG/ML W/ROPIVACAINE 0.15% IN NS 100 ML EPIDURAL (ARMC)
EPIDURAL | Status: AC
  Filled 2019-04-23: qty 100

## 2019-04-23 MED ORDER — AMMONIA AROMATIC IN INHA
RESPIRATORY_TRACT | Status: AC
  Filled 2019-04-23: qty 10

## 2019-04-23 MED ORDER — VARICELLA VIRUS VACCINE LIVE 1350 PFU/0.5ML IJ SUSR
0.5000 mL | INTRAMUSCULAR | Status: AC | PRN
  Administered 2019-04-25: 0.5 mL via SUBCUTANEOUS
  Filled 2019-04-23: qty 0.5

## 2019-04-23 MED ORDER — ACETAMINOPHEN 500 MG PO TABS
1000.0000 mg | ORAL_TABLET | Freq: Four times a day (QID) | ORAL | Status: DC | PRN
  Administered 2019-04-23 – 2019-04-24 (×2): 1000 mg via ORAL
  Filled 2019-04-23 (×2): qty 2

## 2019-04-23 MED ORDER — FENTANYL 2.5 MCG/ML W/ROPIVACAINE 0.15% IN NS 100 ML EPIDURAL (ARMC)
EPIDURAL | Status: DC | PRN
  Administered 2019-04-23: 12 mL/h via EPIDURAL

## 2019-04-23 MED ORDER — MISOPROSTOL 25 MCG QUARTER TABLET: 25.0000 ug | ORAL_TABLET | ORAL | Status: DC | PRN

## 2019-04-23 MED ORDER — TERBUTALINE SULFATE 1 MG/ML IJ SOLN: 0.2500 mg | Freq: Once | INTRAMUSCULAR | Status: DC | PRN

## 2019-04-23 MED ORDER — DOCUSATE SODIUM 100 MG PO CAPS
100.0000 mg | ORAL_CAPSULE | Freq: Two times a day (BID) | ORAL | Status: DC
  Administered 2019-04-23 – 2019-04-25 (×4): 100 mg via ORAL
  Filled 2019-04-23 (×4): qty 1

## 2019-04-23 MED ORDER — LACTATED RINGERS IV SOLN: INTRAVENOUS | Status: DC

## 2019-04-23 MED ORDER — DIBUCAINE (PERIANAL) 1 % EX OINT: 1.0000 "application " | TOPICAL_OINTMENT | CUTANEOUS | Status: DC | PRN

## 2019-04-23 MED ORDER — BUPIVACAINE HCL (PF) 0.25 % IJ SOLN
INTRAMUSCULAR | Status: DC | PRN
  Administered 2019-04-23 (×2): 3 mL via EPIDURAL

## 2019-04-23 MED ORDER — COCONUT OIL OIL
1.0000 "application " | TOPICAL_OIL | Status: DC | PRN
  Administered 2019-04-23: 1 via TOPICAL
  Filled 2019-04-23: qty 120

## 2019-04-23 MED ORDER — LACTATED RINGERS IV SOLN
500.0000 mL | INTRAVENOUS | Status: DC | PRN
  Administered 2019-04-23: 500 mL via INTRAVENOUS

## 2019-04-23 MED ORDER — LIDOCAINE HCL (PF) 1 % IJ SOLN: 30.0000 mL | INTRAMUSCULAR | Status: DC | PRN

## 2019-04-23 MED ORDER — SODIUM CHLORIDE 0.9 % IV SOLN
5.0000 10*6.[IU] | Freq: Once | INTRAVENOUS | Status: AC
  Administered 2019-04-23: 5 10*6.[IU] via INTRAVENOUS
  Filled 2019-04-23: qty 5

## 2019-04-23 MED ORDER — IBUPROFEN 600 MG PO TABS
600.0000 mg | ORAL_TABLET | Freq: Four times a day (QID) | ORAL | Status: DC
  Administered 2019-04-23 – 2019-04-25 (×5): 600 mg via ORAL
  Filled 2019-04-23 (×4): qty 1

## 2019-04-23 NOTE — Anesthesia Procedure Notes (Signed)
Epidural Patient location during procedure: OB Start time: 04/23/2019 9:06 AM End time: 04/23/2019 9:17 AM  Staffing Anesthesiologist: Yves Dill, MD Resident/CRNA: Karoline Caldwell, CRNA Performed: resident/CRNA   Preanesthetic Checklist Completed: patient identified, IV checked, site marked, risks and benefits discussed, surgical consent, monitors and equipment checked, pre-op evaluation and timeout performed  Epidural Patient position: sitting Prep: ChloraPrep Patient monitoring: heart rate, continuous pulse ox and blood pressure Approach: midline Location: L3-L4 Injection technique: LOR saline  Needle:  Needle type: Tuohy  Needle gauge: 17 G Needle length: 9 cm and 9 Needle insertion depth: 5 cm Catheter type: closed end flexible Catheter size: 19 Gauge Catheter at skin depth: 11 cm Test dose: negative and 1.5% lidocaine with Epi 1:200 K  Assessment Events: blood not aspirated, injection not painful, no injection resistance, no paresthesia and negative IV test  Additional Notes 1 attempt Pt. Evaluated and documentation done after procedure finished. Patient identified. Risks/Benefits/Options discussed with patient including but not limited to bleeding, infection, nerve damage, paralysis, failed block, incomplete pain control, headache, blood pressure changes, nausea, vomiting, reactions to medication both or allergic, itching and postpartum back pain. Confirmed with bedside nurse the patient's most recent platelet count. Confirmed with patient that they are not currently taking any anticoagulation, have any bleeding history or any family history of bleeding disorders. Patient expressed understanding and wished to proceed. All questions were answered. Sterile technique was used throughout the entire procedure. Please see nursing notes for vital signs. Test dose was given through epidural catheter and negative prior to continuing to dose epidural or start infusion. Warning signs of  high block given to the patient including shortness of breath, tingling/numbness in hands, complete motor block, or any concerning symptoms with instructions to call for help. Patient was given instructions on fall risk and not to get out of bed. All questions and concerns addressed with instructions to call with any issues or inadequate analgesia.   Patient tolerated the insertion well without immediate complications.Reason for block:procedure for pain

## 2019-04-23 NOTE — H&P (Signed)
OB History & Physical   History of Present Illness:  Chief Complaint: contractions  HPI:  Amber Potter is a 19 y.o. G1P0 female at [redacted]w[redacted]d dated by LMP c/w [redacted]w[redacted]d ultrasound.  She presents to L&D with contractions  She reports:  -active fetal movement -no leakage of fluid -no vaginal bleeding -onset of contractions at 1600 on 04/22/2019 currently every 3-4 minutes  Pregnancy Issues: 1. Varicella non-immune 2. Spanish speaking, limited literacy 3. History of depression, no medications, declined counseling 4. Elevated 1h OGTT, 3h OGTT WNL 5. GBS positive   Maternal Medical History:   Past Medical History:  Diagnosis Date  . Depression    No meds  . History of asthma    Has inhaler--not used in months    Past Surgical History:  Procedure Laterality Date  . NO PAST SURGERIES      No Known Allergies  Prior to Admission medications   Medication Sig Start Date End Date Taking? Authorizing Provider  Prenatal Vit-Fe Fumarate-FA (PRENATAL VITAMINS) 28-0.8 MG TABS Take 1 tablet by mouth daily for 100 doses. 04/01/19 07/10/19 Yes Federico Flake, MD     Prenatal care site: [facility] Dept   Social History: She  reports that she has never smoked. She has never used smokeless tobacco. She reports that she does not drink alcohol or use drugs.  Family History: family history is not on file.   Review of Systems: A full review of systems was performed and negative except as noted in the HPI.    Physical Exam:  Vital Signs: BP 115/66   Pulse 76   Temp 97.9 F (36.6 C) (Oral)   Resp 18   Ht 4\' 11"  (1.499 m)   Wt 65.8 kg   LMP 07/14/2018 (Exact Date)   BMI 29.29 kg/m   General:   alert, cooperative, appears stated age and mild distress  Skin:  normal and no rash or abnormalities  Neurologic:    Alert & oriented x 3  Lungs:   clear to auscultation bilaterally  Heart:   regular rate and rhythm, S1, S2 normal, no murmur, click, rub or gallop   Abdomen:  soft, non-tender; bowel sounds normal; no masses,  no organomegaly  Pelvis:  Exam deferred.  FHT:  135 BPM  Presentations: cephalic  Cervix:  per RN 07/16/2018 at 0100   Dilation: 3cm   Effacement: 60%   Station:  -3  Extremities: : non-tender, symmetric, no edema bilaterally.     EFW: 04/04/2019 growth ultrasound: EFW 7lb4oz 3292g 54%  Results for orders placed or performed during the hospital encounter of 04/22/19 (from the past 24 hour(s))  CBC     Status: Abnormal   Collection Time: 04/23/19 12:46 AM  Result Value Ref Range   WBC 12.5 (H) 4.0 - 10.5 K/uL   RBC 4.18 3.87 - 5.11 MIL/uL   Hemoglobin 11.4 (L) 12.0 - 15.0 g/dL   HCT 06/23/19 (L) 17.5 - 10.2 %   MCV 85.2 80.0 - 100.0 fL   MCH 27.3 26.0 - 34.0 pg   MCHC 32.0 30.0 - 36.0 g/dL   RDW 58.5 27.7 - 82.4 %   Platelets 254 150 - 400 K/uL   nRBC 0.0 0.0 - 0.2 %  Type and screen     Status: None (Preliminary result)   Collection Time: 04/23/19  1:08 AM  Result Value Ref Range   ABO/RH(D) PENDING    Antibody Screen PENDING    Sample Expiration  04/26/2019,2359 Performed at [facility], [address] [place], [place] [address]   Type and screen     Status: None (Preliminary result)   Collection Time: 04/23/19  1:22 AM  Result Value Ref Range   ABO/RH(D) PENDING    Antibody Screen PENDING    Sample Expiration      04/26/2019,2359 Performed at [facility], [address], [place], [place] [address]     Pertinent Results:  Prenatal Labs: Blood type/Rh O+  Antibody screen neg  Rubella Immune  Varicella Non-immune  RPR NR  HBsAg Neg  HIV NR  GC neg  Chlamydia neg  Genetic screening Negative quad screen 11/14/2018  1 hour GTT 139  3 hour GTT 80, 162, 142, 102  GBS positive   FHT: FHR: 130 bpm, variability: moderate,  accelerations:  Present,  decelerations:  Absent Category/reactivity:  Category I TOCO: regular, every 3-4 minutes   Assessment:  Amber Potter is a 19 y.o. G1P0 female at [redacted]w[redacted]d with labor.   Plan:  1. Admit to Labor & Delivery; consents reviewed and obtained  2. Fetal Well being  - Fetal Tracing: category I - GBS positive, penicillin prophylaxis ordered - Presentation: cephalic confirmed by [facility]   3. Routine OB: - Prenatal labs reviewed, as above - Rh positive - CBC & T&S on admit - Clear fluids, IVF  4. Monitoring of Labor: - Contractions monitored with external toco in place - Plan for expectant managmeent - Plan for continuous fetal monitoring  - Maternal pain control as desired: IVPM, regional anesthesia - Anticipate vaginal delivery  5. Post Partum Planning: - Infant feeding: breast - Contraception: Depo-Provera  Lisette Grinder, CNM 04/23/2019 1:52 AM ----- Lisette Grinder Certified Nurse Midwife [facility], Department of [facility]

## 2019-04-23 NOTE — Discharge Summary (Signed)
Obstetrical Discharge Summary  Patient Name: Amber Potter DOB: June 17, 2000 MRN: [id number]  Date of Admission: 04/22/2019 Date of Delivery: 04/23/2019 Delivered by: Drinda Butts CNM  Date of Discharge: 04/25/2019  Primary OB: [facility] GYK:[id number]'V last menstrual period was 07/14/2018 (exact date). EDC Estimated Date of Delivery: 04/20/19 Gestational Age at Delivery: [redacted]w[redacted]d   Antepartum complications:  1. Varicella non-immune 2. Spanish speaking, limited literacy 3. History of depression, no medications, declined counseling 4. Elevated 1h OGTT, 3hOGTT WNL 5. GBS positive   Admitting Diagnosis: Early labor  Secondary Diagnosis: Patient Active Problem List   Diagnosis Date Noted  . Post-term pregnancy, 40-42 weeks of gestation 04/23/2019  . Uterine size-date discrepancy in third trimester 04/01/2019  . Group B Streptococcus carrier, +RV culture, currently pregnant 03/31/2019  . Vaginal bleeding in pregnancy, third trimester 02/24/2019  . Abnormal 1 hr GTT, 3 hr wnl 01/23/2019  . Gastroesophageal reflux in pregnancy 01/22/2019  . Illiteracy 01/08/2019  . Susceptible to Varicella (non-immune), currently pregnant in second trimester 10/14/2018  . Abnormal blood finding 10/09/18 leukocytosis (WBC=12.3, 11.7) 10/14/2018  . Asthma 10/09/2018  . Supervision of high risk pregnancy, antepartum 10/09/2018  . Major depressive disorder, single episode, moderate ([place]) 01/21/2018    Augmentation: Pitocin Complications: None Intrapartum complications/course: Amber Potter presented to L&D with regular, painful contractions, in early labor.  Labor augmented with pitocin.  Progressed well to c/c/+3, had excellent maternal effort with pushing  Delivery Type: spontaneous vaginal delivery Anesthesia: epidural Placenta: spontaneous Laceration: 2nd degree perineal  Episiotomy: none Newborn Data: Live born female "Jose" Birth Weight:  3459g 7lb10oz APGAR: 8, 8  Newborn Delivery   Birth date/time:  04/23/2019 15:08:00 Delivery type: Vaginal, Spontaneous      Postpartum Procedures: none  Edinburgh:  Edinburgh Postnatal Depression Scale Screening Tool 04/24/2019 04/23/2019  I have been able to laugh and see the funny side of things. 0 (No Data)  I have looked forward with enjoyment to things. 0 -  I have blamed myself unnecessarily when things went wrong. 3 -  I have been anxious or worried for no good reason. 0 -  I have felt scared or panicky for no good reason. 2 -  Things have been getting on top of me. 0 -  I have been so unhappy that I have had difficulty sleeping. 0 -  I have felt sad or miserable. 0 -  I have been so unhappy that I have been crying. 1 -  The thought of harming myself has occurred to me. 0 -  Edinburgh Postnatal Depression Scale Total 6 -     Post partum course:  Patient had an uncomplicated postpartum course.  By time of discharge on PPD#2, her pain was controlled on oral pain medications; she had appropriate lochia and was ambulating, voiding without difficulty and tolerating regular diet.  She was deemed stable for discharge to home.    Discharge Physical Exam:  BP 98/63 (BP Location: Right Arm)   Pulse 76   Temp 98.2 F (36.8 C) (Oral)   Resp 19   Ht 4\' 11"  (1.499 m)   Wt 65.8 kg   LMP 07/14/2018 (Exact Date)   SpO2 99%   Breastfeeding Unknown   BMI 29.29 kg/m   General: NAD CV: RRR Pulm: CTABL, nl effort ABD: s/nd/nt, fundus firm and below the umbilicus Lochia: moderate DVT Evaluation: LE non-ttp, no evidence of DVT on exam.  Hemoglobin  Date Value Ref Range Status  04/24/2019 10.1 (L) 12.0 - 15.0 g/dL Final  12/11/2018 11.6 11.1 - 15.9 g/dL Final   HCT  Date Value Ref Range Status  04/24/2019 31.5 (L) 36.0 - 46.0 % Final   Hematocrit  Date Value Ref Range Status  12/11/2018 37.6 34.0 - 46.6 % Final     Disposition: stable, discharge to home. Baby Feeding: breastmilk & formula Baby Disposition: home with mom  Rh Immune  globulin given: Rh pos Rubella vaccine given: Rubella immune  Varivax vaccine given: Varicella non-immune, Varivax ordered to be given postpartum Flu vaccine given in AP or PP setting: given 12/11/2018 TDap vaccine given in AP or PP setting: given 01/22/2019  Contraception: TBD  Prenatal Labs:  Prenatal Labs: Blood type/Rh O+  Antibody screen neg  Rubella Immune  Varicella Non-immune  RPR NR  HBsAg Neg  HIV NR  GC neg  Chlamydia neg  Genetic screening Negative quad screen 11/14/2018  1 hour GTT 139  3 hour GTT 80, 162, 142, 102  GBS positive    Plan:  Amber Potter was discharged to home in good condition. Follow-up appointment with delivering provider in 6 weeks.  Discharge Medications: Allergies as of 04/25/2019   No Known Allergies     Medication List    TAKE these medications   acetaminophen 500 MG tablet Commonly known as: TYLENOL Take 2 tablets (1,000 mg total) by mouth every 6 (six) hours as needed for mild pain or moderate pain.   ferrous sulfate 325 (65 FE) MG tablet Take 1 tablet (325 mg total) by mouth daily with breakfast.   ibuprofen 600 MG tablet Commonly known as: ADVIL Take 1 tablet (600 mg total) by mouth every 6 (six) hours as needed for mild pain, moderate pain or cramping.   Prenatal Vitamins 28-0.8 MG Tabs Take 1 tablet by mouth daily for 100 doses.       Follow-up Information    Gustavo Lah, CNM Follow up in 4 week(s).   Specialty: Certified Nurse Midwife Why: postpartum visit  Contact information: [address] [place] [address] [phone]           Signed: Genia Del, CNM 04/25/2019 8:03 AM

## 2019-04-23 NOTE — Anesthesia Preprocedure Evaluation (Signed)
Anesthesia Evaluation  Patient identified by MRN, date of birth, ID band Patient awake    Reviewed: Allergy & Precautions, H&P , NPO status , Patient's Chart, lab work & pertinent test results  Airway Mallampati: III  TM Distance: >3 FB Neck ROM: full    Dental  (+) Teeth Intact   Pulmonary asthma ,    Pulmonary exam normal        Cardiovascular Normal cardiovascular exam     Neuro/Psych    GI/Hepatic GERD (Well controlled)  Medicated,  Endo/Other    Renal/GU      Musculoskeletal   Abdominal   Peds  Hematology   Anesthesia Other Findings Interpreter used for H&P/epidural placement  Reproductive/Obstetrics (+) Pregnancy                             Anesthesia Physical Anesthesia Plan  ASA: II  Anesthesia Plan: Epidural   Post-op Pain Management:    Induction:   PONV Risk Score and Plan:   Airway Management Planned:   Additional Equipment:   Intra-op Plan:   Post-operative Plan:   Informed Consent: I have reviewed the patients History and Physical, chart, labs and discussed the procedure including the risks, benefits and alternatives for the proposed anesthesia with the patient or authorized representative who has indicated his/her understanding and acceptance.     Dental Advisory Given  Plan Discussed with: Anesthesiologist and CRNA  Anesthesia Plan Comments:         Anesthesia Quick Evaluation

## 2019-04-23 NOTE — Discharge Instructions (Signed)
Parto vaginal Vaginal Delivery  Parto vaginal significa que usted da a luz empujando al beb fuera del canal del parto (vagina). Un equipo de proveedores de atencin mdica la ayudar antes, durante y despus del parto vaginal. Las experiencias de los nacimientos son nicas para todas las mujeres, y cada embarazo y las experiencias de nacimiento varan segn dnde elija dar a luz. Qu ocurrir cuando llegue al centro de parto o al hospital? Una vez que se inicie el trabajo de parto y haya sido admitida en el hospital o centro de parto, el mdico podr hacer lo siguiente:  Revisar sus antecedentes de embarazo y cualquier inquietud que usted pueda tener.  Colocarle una va intravenosa en una de las venas. Esto se podr usar para administrarle lquidos y medicamentos.  Verificar su presin arterial, pulso, temperatura y frecuencia cardaca (signos vitales).  Verificar si la bolsa de agua (saco amnitico) se ha roto (ruptura).  Hablar con usted sobre su plan de nacimiento y analizar las opciones para controlar el dolor. Monitoreo Su mdico puede monitorear las contracciones (monitoreo uterino) y la frecuencia cardaca del beb (monitoreo fetal). Es posible que el monitoreo se necesite realizar:  Con frecuencia, pero no continuamente (intermitentemente).  Todo el tiempo o durante largos perodos a la vez (continuamente). El monitoreo continuo puede ser necesario si: ? Est recibiendo determinados medicamentos, tales como medicamentos para aliviar el dolor o para hacer que las contracciones sean ms fuertes. ? Tiene complicaciones durante el embarazo o el trabajo de parto. El monitoreo se puede realizar:  Al colocar un estetoscopio especial o un dispositivo manual de monitoreo en el abdomen o verificar los latidos cardacos del beb y comprobar las contracciones.  Al colocar monitores en el abdomen (monitores externos) para registrar los latidos cardacos del beb y la frecuencia y duracin de  las contracciones.  Al colocar monitores dentro del tero a travs de la vagina (monitores internos) para registrar los latidos cardacos del beb y la frecuencia, duracin y fuerza de sus contracciones. Segn el tipo de monitor, puede permanecer en el tero o en la cabeza del beb hasta el nacimiento.  Telemetra. Se trata de un tipo de monitoreo continuo que se puede realizar con monitores externos o internos. En lugar de tener que permanecer en la cama, usted puede moverse durante la telemetra. Examen fsico Su mdico puede realizar exmenes fsicos frecuentes. Esto puede incluir lo siguiente:  Verificar cmo y dnde el beb est ubicado en el tero.  Verificar el cuello uterino para determinar: ? Si se est afinando o estirando (borrando). ? Si se est abriendo (dilatando). Qu sucede durante el trabajo de parto y el parto?  El trabajo de parto y el parto normales se dividen en tres etapas: Etapa 1  Esta es la etapa ms larga del trabajo de parto.  Esta etapa puede durar horas o das.  Durante esta etapa, sentir contracciones. En general, las contracciones son leves, infrecuentes e irregulares al principio. Se hacen ms fuertes, ms frecuentes (aproximadamente cada 2 o 3 minutos) y ms regulares a medida que avanza en esta etapa.  Esta etapa finaliza cuando el cuello uterino est completamente dilatado hasta 4 pulgadas (10cm) y completamente borrado. Etapa 2  Esta etapa comienza una vez que el cuello uterino est totalmente borrado y dilatado, y dura hasta el nacimiento del beb.  Esta etapa puede durar de 20 minutos a 2 horas.  Esta es la etapa en la que va a sentir ganas de pujar al beb fuera de la   vagina.  Puede sentir un dolor urente y por estiramiento, especialmente cuando la parte ms ancha de la cabeza del beb pasa a travs de la abertura vaginal (coronacin).  Una vez que el beb nace, el cordn umbilical se pinzar y se cortar. Esto ocurre por lo general despus  de un perodo de 1 a 2 minutos despus del parto.  Colocarn al beb sobre su pecho desnudo (contacto piel con piel) en una posicin erguida y cubierto con una manta abrigada. Observe al beb para detectar seales de hambre, como el reflejo de bsqueda o succin, y acrquelo al pecho para su primera alimentacin. Etapa 3  Esta etapa comienza inmediatamente despus del nacimiento del beb y finaliza despus de la expulsin de la placenta.  Esta etapa puede durar de 5 a 30 minutos.  Despus del nacimiento del beb, puede sentir contracciones cuando el cuerpo expulsa la placenta y el tero se contrae para controlar la hemorragia. Qu puedo esperar despus del trabajo de parto y el parto?  Una vez que termine el trabajo de parto, se los controlar a usted y al beb atentamente para tener la seguridad de que ambos estn sanos y listos para ir a casa. Su equipo de atencin mdica le ensear cmo cuidarse y cuidar a su beb.  Usted y el beb permanecern en la misma habitacin (cohabitacin) durante su estada en el hospital. Esto estimular una vinculacin temprana y una lactancia exitosa.  Puede seguir recibiendo lquidos o medicamentos por va intravenosa.  Se le controlar y masajear el tero con regularidad (masaje fndico).  Tendr algo de inflamacin y dolor en el abdomen, la vagina y la zona de la piel entre la abertura vaginal y el ano (perineo).  Si se le realiz una incisin cerca de la vagina (episiotoma) o si ha tenido algn desgarro durante el parto, podran indicarle que se coloque compresas fras sobre la episiotoma o el desgarro. Esto ayuda a aliviar el dolor y la hinchazn.  Es posible que le den una botella rociadora para que use cuando vaya al bao para higienizarse. Siga los pasos a continuacin para usar la botella rociadora: ? Antes de orinar, llene la botella rociadora con agua tibia. No use agua caliente. ? Despus de orinar, mientras an est sentada en el inodoro,  use la botella rociadora para enjuagar el rea alrededor de la uretra y la abertura vaginal. Con esto podr limpiar cualquier rastro de orina y sangre. ? Llene la botella rociadora con agua limpia cada vez que vaya al bao.  Es normal tener hemorragia vaginal despus del parto. Use un apsito sanitario para el sangrado vaginal y secrecin. Resumen  Parto vaginal significa que usted dar a luz empujando al beb fuera del canal del parto (vagina).  Su mdico puede monitorear las contracciones (monitoreo uterino) y la frecuencia cardaca del beb (monitoreo fetal).  Su mdico puede realizarle un examen fsico.  El trabajo de parto y el parto normales se dividen en tres etapas.  Una vez que termina el trabajo de parto, se los controlar a usted y al beb atentamente hasta que estn listos para ir a casa. Esta informacin no tiene como fin reemplazar el consejo del mdico. Asegrese de hacerle al mdico cualquier pregunta que tenga. Document Revised: 03/14/2017 Document Reviewed: 03/14/2017 Elsevier Patient Education  2020 Elsevier Inc.  

## 2019-04-23 NOTE — Progress Notes (Signed)
Labor Progress Note  Amber Potter is a 19 y.o. G1P0 at [redacted]w[redacted]d by LMP admitted for augmentation of early labor, GBS pos  Subjective: resting with epidural, feeling intermittent pressure  Objective: BP (!) 102/53 (BP Location: Left Arm)   Pulse 73   Temp 98.2 F (36.8 C) (Oral)   Resp 15   Ht 4\' 11"  (1.499 m)   Wt 65.8 kg   LMP 07/14/2018 (Exact Date)   SpO2 (!) 89%   BMI 29.29 kg/m  Notable VS details: reviewed   Fetal Assessment: FHT:  FHR: 125 bpm, variability: moderate,  accelerations:  Present,  decelerations:  Present intermittent variables  Category/reactivity:  Category II UC:   regular, every 2-4 minutes, moderate to palpation  SVE:   8/100/+1, more cervix on maternal right  Membrane status: SROM at 0935 Amniotic color: Thick meconium   Labs: Lab Results  Component Value Date   WBC 12.5 (H) 04/23/2019   HGB 11.4 (L) 04/23/2019   HCT 35.6 (L) 04/23/2019   MCV 85.2 04/23/2019   PLT 254 04/23/2019    Assessment / Plan: Augmentation of labor, progressing well, Dr. 06/23/2019 updated on plan of care   Labor: progressing on pitocin, has made good cervical change since this morning.  Will continue to use positioning to help with fetal position and descent  Preeclampsia:  no s/s Fetal Wellbeing:  Category II Pain Control:  Epidural I/D:  afebrile    Dalbert Garnet, CNM 04/23/2019, 12:59 PM

## 2019-04-23 NOTE — Progress Notes (Signed)
Labor Progress Note  Amber Potter is a 19 y.o. G1P0 at [redacted]w[redacted]d by LMP admitted for augmentation of early labor, GBS pos. Interpreter used for communication.    Subjective: standing at bedside, breathing with contractions   Objective: BP 106/65 (BP Location: Left Arm)   Pulse 94   Temp (!) 97.5 F (36.4 C) (Oral)   Resp 18   Ht 4\' 11"  (1.499 m)   Wt 65.8 kg   LMP 07/14/2018 (Exact Date)   BMI 29.29 kg/m  Notable VS details: reviewed   Fetal Assessment: FHT:  FHR: 145 bpm, variability: moderate,  accelerations:  Present,  decelerations:  Absent Category/reactivity:  Category I UC:   regular, every 2-3 minutes, moderate to palpation  SVE:   4/80/-1 Membrane status: Intact Amniotic color: N/A  Labs: Lab Results  Component Value Date   WBC 12.5 (H) 04/23/2019   HGB 11.4 (L) 04/23/2019   HCT 35.6 (L) 04/23/2019   MCV 85.2 04/23/2019   PLT 254 04/23/2019    Assessment / Plan: Early labor, minimal cervical change over the last 8 hours  Labor: Pitocin at 54milliunits/min, will titrate for stronger contractions Preeclampsia:  no s/s Fetal Wellbeing:  Category I Pain Control:  Labor support without epidural, considering epidural I/D:  n/a   3m, CNM 04/23/2019, 8:30 AM

## 2019-04-24 LAB — CBC
HCT: 31.5 % — ABNORMAL LOW (ref 36.0–46.0)
Hemoglobin: 10.1 g/dL — ABNORMAL LOW (ref 12.0–15.0)
MCH: 27.6 pg (ref 26.0–34.0)
MCHC: 32.1 g/dL (ref 30.0–36.0)
MCV: 86.1 fL (ref 80.0–100.0)
Platelets: 213 10*3/uL (ref 150–400)
RBC: 3.66 MIL/uL — ABNORMAL LOW (ref 3.87–5.11)
RDW: 14.6 % (ref 11.5–15.5)
WBC: 13.3 10*3/uL — ABNORMAL HIGH (ref 4.0–10.5)
nRBC: 0 % (ref 0.0–0.2)

## 2019-04-24 MED ORDER — MEDROXYPROGESTERONE ACETATE 150 MG/ML IM SUSP
150.0000 mg | Freq: Once | INTRAMUSCULAR | Status: AC
  Administered 2019-04-25: 150 mg via INTRAMUSCULAR
  Filled 2019-04-24: qty 1

## 2019-04-24 MED ORDER — FERROUS SULFATE 325 (65 FE) MG PO TABS
325.0000 mg | ORAL_TABLET | Freq: Every day | ORAL | Status: DC
  Administered 2019-04-25: 08:00:00 325 mg via ORAL
  Filled 2019-04-24: qty 1

## 2019-04-24 NOTE — Clinical Social Work Maternal (Addendum)
CLINICAL SOCIAL WORK MATERNAL/CHILD NOTE  Patient Details  Name: Amber Potter MRN: [id number] Date of Birth: 01/13/2001  Date:  04/24/2019  Clinical Social Worker Initiating Note:  Alliah Boulanger, LCSW Date/Time: Initiated:  04/24/19/      Child's Name:  Amber Potter   Biological Parents:  Mother, Father   Need for Interpreter:  None   Reason for Referral:  Other (Comment)(MOB history of depression, literacy concerns)   Address:  [address] [place] [address]    Phone number:  [phone] (home)     Additional phone number: None reported  Household Members/Support Persons (HM/SP):   Household Member/Support Person 1   HM/SP Name Relationship DOB or Age  HM/SP -1 Amber Potter FOB unknown  HM/SP -2        HM/SP -3        HM/SP -4        HM/SP -5        HM/SP -6        HM/SP -7        HM/SP -8          Natural Supports (not living in the home):  Immediate Family, Extended Family, Friends, Spouse/significant other   Professional Supports:     Employment: Unemployed   Type of Work:     Education:  Other (comment)(Completed 6th grade)   Homebound arranged: No  Financial Resources:  Medicaid   Other Resources:      Cultural/Religious Considerations Which May Impact Care:  None reported  Strengths:  Ability to meet basic needs , Compliance with medical plan , Home prepared for child , Pediatrician chosen   Psychotropic Medications:         Pediatrician:    [place]  Pediatrician List:   [place]    [place]    [place] ([facility])  [place]    [place]    [place]      Pediatrician Fax Number:    Risk Factors/Current Problems:  None   Cognitive State:  Alert , Able to Concentrate    Mood/Affect:  Calm , Flat    CSW Assessment: CSW received consult due to MOB having a history of depression and literacy concerns per consult. CSW spoke with RN Amber Potter prior to assessment. Interpreter  tablet was used due to language barrier. Spanish Interpreter used was Amber Potter. MOB elected for FOB, Amber Potter, to remain at bedside during assessment.   MOB was short with responses, but was engaged and gave appropriate answers. MOB reported this is her first baby. She feels she has a good support system, including FOB. FOB was very supportive and helping to care for [name] while CSW was in the room.   CSW inquired about MOB history of depression. MOB and FOB denied MOB having any mental health issues, past or current. MOB reported she feels that she is coping well emotionally and has a good support system. She denied SI, HI, or DV. CSW provided education on PPD and SIDS and encouraged MOB and FOB to reach out to MOB's Medical Provider or Baby's Pediatrician with any questions or needs for support, including after discharge. FOB reported they plan to use [facility] for [name]'s medical care.   FOB reported they have a crib and are going to purchase a bassinet for Baby. FOB reported they also have a new car seat and all other items needed for Baby. FOB will provide transportation for Baby to   appointments and reported he has a reliable means of transport.  Due to staff concerns about MOB's literacy and ability to understand Baby's needs, CSW asked MOB and FOB if they would be agreeable to a [facility] referral for additional support/linking to resources whenever they go home with Baby. They were both agreeable to this referral. CSW called Health Department and left voicemail for [facility] Representative Amber Potter requesting a return call so that referral can be made. FOB seemed attentive and understanding of MOB and Baby's needs.  MOB and FOB denied questions or additional needs at this time and were encouraged to reach out if any arise.  CSW Plan/Description:  Sudden Infant Death Syndrome (SIDS) Education, Perinatal Mood and Anxiety Disorder (PMADs) Education, No Further Intervention  Required/No Barriers to Discharge, Other Patient/Family Education, Other Information/Referral to Community Resources(CC4C referral)    Amber Potter E Amber Fout, LCSW 04/24/2019, 11:14 AM 

## 2019-04-24 NOTE — Lactation Note (Signed)
This note was copied from a baby's chart. Lactation Consultation Note  Patient Name: Amber Potter [id number] Date: 04/24/2019     [employer] student entered room to check in on how breastfeeding is going. Support person not present. MOB laying in bed with baby laying beside MOB in bed.   MOB confirmed that bay was feeding at breast and 15 minutes ago was the last feed. MOB affirmed all questions answered at this time.   MOB affirmed that all questions answered at this time and knows to call RN for help.    Maternal Data    Feeding Feeding Type: Breast Fed  LATCH Score                   Interventions    Lactation Tools Discussed/Used     Consult Status      Carlena Hurl 04/24/2019, 4:53 PM

## 2019-04-24 NOTE — Lactation Note (Signed)
This note was copied from a baby's chart. Lactation Consultation Note  Patient Name: Amber Potter [id number] Date: 04/24/2019 Reason for consult: Initial assessment;Primapara;1st time breastfeeding  [facility] student and Nursing student Madelin Rear entered room and baby was asleep in bassinet. Support person present.  [facility] [employer] [facility] student called interpreter [place] # [phone] for Spanish language services.   [employer] [employer] [facility] [facility] student congratulated family on first baby and assessed goals of feeding. Goals include feeding with both formula and at the breast.   [employer] [employer] student provided education on hunger cues and role of supply and demand in the establishment of breastfeeding and educated on risks of formula feeding, in respect to amounts and baby belly size.   Since last nipple stimulation was last night, [facility] student offered help with setting up a DEBP and hand expression education. On hand expression, glistens observed. Baby placed in football hold on left breast and had a shallow latch. [employer] student demonstrated how to properly break latch with a clean finger and educated family that there should be no pain.   [facility] Student educated on importance of a wide gape and deep latch and assisted with getting baby onto breast deeper and demonstrated the lips of a "duck". MOB affirmed no pain felt at the time.   Feeding was still continuing when [facility] student left room after 15 minutes of observing feeding.  MOB and support person affirmed all questions answered at this time, know to call Lactation, PRN.    Maternal Data Has patient been taught Hand Expression?: Yes  Feeding Feeding Type: Breast Fed  LATCH Score Latch: Grasps breast easily, tongue down, lips flanged, rhythmical sucking.  Audible Swallowing: A few with stimulation  Type of Nipple: Everted at rest and after stimulation  Comfort (Breast/Nipple): Soft / non-tender  Hold (Positioning): Assistance needed to correctly position infant at breast and maintain latch.(MOB  needed assistance with assuring flanged lips before latch)  LATCH Score: 8  Interventions Interventions: Breast feeding basics reviewed;Assisted with latch;Skin to skin;Breast massage;Hand express;Adjust position;Support pillows;Position options  Lactation Tools Discussed/Used     Consult Status Consult Status: Follow-up Date: 04/24/19 Follow-up type: In-patient    Carlena Hurl 04/24/2019, 11:07 AM

## 2019-04-24 NOTE — Anesthesia Postprocedure Evaluation (Signed)
Anesthesia Post Note  Patient: Amber Potter  Procedure(s) Performed: AN AD HOC LABOR EPIDURAL  Patient location during evaluation: Mother Baby Anesthesia Type: Epidural Level of consciousness: awake and alert Pain management: pain level controlled Vital Signs Assessment: post-procedure vital signs reviewed and stable Respiratory status: spontaneous breathing, nonlabored ventilation and respiratory function stable Cardiovascular status: stable Postop Assessment: no headache, no backache, epidural receding and able to ambulate Anesthetic complications: no     Last Vitals:  Vitals:   04/24/19 0240 04/24/19 0728  BP: (!) 85/60 96/66  Pulse: 87 79  Resp: 16 20  Temp: 36.4 C 37 C  SpO2: 95% 98%    Last Pain:  Vitals:   04/24/19 0728  TempSrc: Oral  PainSc:                  Rosanne Gutting

## 2019-04-24 NOTE — Progress Notes (Signed)
Post Partum Day 1  *Interpretation provided by in person Spanish language interpretor Delos Haring*   Subjective: Doing well, no concerns. Ambulating without difficulty, pain managed with PO meds, tolerating regular diet, and voiding without difficulty.   No fever/chills, chest pain, shortness of breath, nausea/vomiting, or leg pain. No nipple or breast pain.  Objective: BP (!) 103/58 (BP Location: Right Arm)   Pulse 88   Temp 98.5 F (36.9 C) (Oral)   Resp 20   Ht 4\' 11"  (1.499 m)   Wt 65.8 kg   LMP 07/14/2018 (Exact Date)   SpO2 96%   Breastfeeding Unknown   BMI 29.29 kg/m    Physical Exam:  General: alert, cooperative, appears stated age and fatigued Breasts: soft/nontender CV: RRR Pulm: nl effort, CTABL Abdomen: soft, non-tender, active bowel sounds Uterine Fundus: firm Lochia: appropriate DVT Evaluation: No evidence of DVT seen on physical exam. No cords or calf tenderness. No significant calf/ankle edema.  Recent Labs    04/23/19 0046 04/24/19 0626  HGB 11.4* 10.1*  HCT 35.6* 31.5*  WBC 12.5* 13.3*  PLT 254 213    Assessment/Plan: 18 y.o. G1P1001 postpartum day # 1  -Continue routine postpartum care -Lactation consult PRN for breastfeeding  -Plans Depo-Provera for contraception, ordered to offer prior to discharge.  -Acute blood loss anemia - hemodynamically stable and asymptomatic; start PO ferrous sulfate daily with stool softeners  -Immunization status: all immunizations up to date  Disposition: Continue inpatient postpartum care    LOS: 1 day   06/24/19, CNM 04/24/2019, 12:21 PM   ----- 06/24/2019 Certified Nurse Midwife [facility]

## 2019-04-24 NOTE — Progress Notes (Signed)
2:30- Voicemail from [employer] with [employer]. She will email me the referral form.    Alfonso Ramus, [place] [phone]

## 2019-04-25 ENCOUNTER — Ambulatory Visit: Payer: Self-pay

## 2019-04-25 MED ORDER — ACETAMINOPHEN 500 MG PO TABS: 1000.0000 mg | ORAL_TABLET | Freq: Four times a day (QID) | ORAL | 0 refills | Status: DC | PRN

## 2019-04-25 MED ORDER — IBUPROFEN 600 MG PO TABS: 600.0000 mg | ORAL_TABLET | Freq: Four times a day (QID) | ORAL | 0 refills | Status: DC | PRN

## 2019-04-25 MED ORDER — FERROUS SULFATE 325 (65 FE) MG PO TABS: 325.0000 mg | ORAL_TABLET | Freq: Every day | ORAL | 0 refills | Status: DC

## 2019-04-25 NOTE — Progress Notes (Signed)
Pt discharged with infant. Discharge instructions, prescriptions, and follow up appointments given to and reviewed with patient. Pt verbalized understanding. Escorted out by staff. 

## 2019-04-25 NOTE — Lactation Note (Signed)
This note was copied from a baby's chart. Lactation Consultation Note  Patient Name: Amber Potter Date: 04/25/2019   Mom had given [place] large volumes of formula via bottle all night.  Questioned mom's desire to breast feed through interpreter.  She reports wanting to breast feed but has not been able to get him latched to the breast and does not think she has any milk.  Demonstrated hand expression and expressed copious amounts of colostrum.  Explained supply and demand and need to continue to put him to the breast rather than bottle feeding to bring in mature milk, ensure a plentiful milk supply and prevent engorgement.  Assisted mom in comfortable position with pillow support in football hold skin to skin demonstrating how to sandwich the breast to get a deep latch.  Amber Potter latched with minimal assistance.  At first he was a little fussy explaining to parents that he was probably used to getting the milk faster from the bottle with less effort on his part.  Demonstrated how to massage the breast to keep him actively sucking at the breast.  Once he realized he could get plenty of milk if he kept sucking, he began strong rhythmic sucking with audible swallows for 35 minutes.  Mom given DEBP kit and instructed parents in how to manually pump if and when needed.  Reviewed normal newborn stomach size, how to know he is getting enough milk, normal course of lactation and routine newborn feeding patterns.  Lactation number given and encouraged to call with any questions, concerns or if further assistance needed.    Maternal Data    Feeding    LATCH Score                   Interventions    Lactation Tools Discussed/Used     Consult Status      Amber Potter 04/25/2019, 3:11 PM

## 2019-04-25 NOTE — Progress Notes (Signed)
Faxed referral form to [facility].  Alfonso Ramus, [place] [phone]

## 2019-05-07 ENCOUNTER — Telehealth: Payer: Self-pay | Admitting: Licensed Clinical Social Worker

## 2019-05-07 NOTE — Telephone Encounter (Signed)
-----   Message from Kathreen Cosier, [place] sent at 04/24/2019  9:58 AM EDT ----- Regarding: 2 week postpartum mood check needed 4/21 Pt hx of depression delivered 04/07

## 2019-05-07 NOTE — Telephone Encounter (Signed)
language line [phone]. attempted call for postpartum mood check.

## 2019-05-08 ENCOUNTER — Telehealth: Payer: Self-pay | Admitting: Licensed Clinical Social Worker

## 2019-05-08 NOTE — Telephone Encounter (Signed)
LCSW and Interpreter Marlene Yemen contacted patient for postpartum mood check. LCSW conducted brief assessment, patient denies any mood or anxiety symptoms or any concerns. She reports she is sleeping well, mildly low appetite, breast feeding is going well, and reports having a good support system. LCSW provided brief psychoeducation on perinatal mood and anxiety issues and encouraged patient to reach out if she experiences any concerns.

## 2019-06-09 ENCOUNTER — Encounter: Payer: Self-pay | Admitting: Family Medicine

## 2019-06-09 ENCOUNTER — Ambulatory Visit: Payer: Self-pay | Admitting: Family Medicine

## 2019-06-09 ENCOUNTER — Other Ambulatory Visit: Payer: Self-pay

## 2019-06-09 ENCOUNTER — Ambulatory Visit (LOCAL_COMMUNITY_HEALTH_CENTER): Payer: Self-pay

## 2019-06-09 VITALS — BP 121/79 | Wt 138.6 lb

## 2019-06-09 DIAGNOSIS — Z23 Encounter for immunization: Secondary | ICD-10-CM

## 2019-06-09 DIAGNOSIS — Z3009 Encounter for other general counseling and advice on contraception: Secondary | ICD-10-CM

## 2019-06-09 LAB — WET PREP FOR TRICH, YEAST, CLUE
Trichomonas Exam: NEGATIVE
Yeast Exam: NEGATIVE

## 2019-06-09 LAB — HEMOGLOBIN, FINGERSTICK: Hemoglobin: 13.2 g/dL (ref 11.1–15.9)

## 2019-06-09 MED ORDER — THERA VITAL M PO TABS: 1.0000 | ORAL_TABLET | Freq: Every day | ORAL | 0 refills | Status: DC

## 2019-06-09 MED ORDER — MEDROXYPROGESTERONE ACETATE 150 MG/ML IM SUSP: 150.0000 mg | INTRAMUSCULAR | Status: DC

## 2019-06-09 NOTE — Progress Notes (Signed)
Here for PP exam, needs second dose Varicella vaccine today. Varicella given today, left arm, tolerated well. VIS given. Patient counseled no other vaccines within the next month.Burt Knack, RN

## 2019-06-09 NOTE — Progress Notes (Signed)
Patient here for PP PE. SVD on 04/25/2019, and received Depo at the hospital, received varivax #1 at the hospital as well. Needs Pap and varivax #2 today.Burt Knack, RN

## 2019-06-09 NOTE — Progress Notes (Signed)
Varicella vaccine #2 given today, see immunization document for details. Hgb checked no need for iron, wet prep reviewed, no treatment indicated. Patient given [employer] card and resource list regarding alcohol overuse per provider request. Patient aware she needs to return for next Depo during 1st week of July. Patient will need Depo consent at next Depo appointment.Burt Knack, RN

## 2019-06-09 NOTE — Progress Notes (Signed)
Post Partum Exam  Amber Potter is a 19 y.o. G77P1001 female who presents for a postpartum visit. She is 6 weeks postpartum following a spontaneous vaginal delivery. I have fully reviewed the prenatal and intrapartum course. The delivery was at [redacted]w[redacted]d gestational weeks.  Anesthesia: epidural. Labor augmented with pitocin. 2nd degree lac following SVD. Postpartum course has been going well. Baby's course has been going well. Baby is feeding by Bottle and Breast Bleeding no bleeding. Bowel function is normal. Bladder function is normal. Patient is sexually active. Contraception method is Depo-Provera injections.   Pt denies all of the following, which are contraindications to Depo use: Known breast cancer Pregnancy Also denies: Hypertension (CDC cat 2 if mild, cat 3 if severe) Severe cirrhosis, hepatocellular adenoma Diabetes with nephrosis or vascular complications Ischemic heart disease or multiple risk factors for atherosclerotic disease, and some forms of lupus Unexplained vaginal bleeding Pregnancy planned within the next year Long-term use of corticosteroid therapy in women with a history of, or risk factors for, nontraumatic (frailty) fractures.  Current use of aminoglutethimide (usually for the treatment of Cushing's syndrome) because aminoglutethimide may increase metabolism of progestins   EPDS score 6. On Modified 5 p's pt write that her partner has problems w/alcohol and/or drugs. States he drinks a lot daily, which worries her if they have to drive somewhere to get diapers, groceries, etc after he has been drinking. She denies physical abuse and states she is comfortable in this relationship.    Postpartum depression screening: Edinburgh Postnatal Depression Scale - 06/09/19 0949      Edinburgh Postnatal Depression Scale:  In the Past 7 Days   I have been able to laugh and see the funny side of things.  0    I have looked forward with enjoyment to things.  0    I have  blamed myself unnecessarily when things went wrong.  0    I have been anxious or worried for no good reason.  0    I have felt scared or panicky for no good reason.  0    Things have been getting on top of me.  0    I have been so unhappy that I have had difficulty sleeping.  3    I have felt sad or miserable.  0    I have been so unhappy that I have been crying.  3    The thought of harming myself has occurred to me.  0    Edinburgh Postnatal Depression Scale Total  6       The following portions of the patient's history were reviewed and updated as appropriate: allergies, current medications, past family history, past medical history, past social history, past surgical history, and problem list.  Review of Systems A comprehensive 10 point review of systems was negative except for:  Weight gain: s/p pregnancy      Objective:  BP 121/79   Wt 138 lb 9.6 oz (62.9 kg)   LMP  (LMP Unknown) Comment: No mestrual period since birth of baby  Breastfeeding Yes   BMI 27.99 kg/m   Gen: well appearing, NAD HEENT: no scleral icterus, thyroid not enlarged  CV: regular rate and rhythm Lung: clear to auscultation bilaterally Breast: exam not indicated Abdomen: nontender, uterus appropriately involuted GU: external genitalia normal, laceration well healed; vagina normal; cervix normal, no CMT; no adnexal tenderness or masses. Rectal: exam not indicated Ext: warm well perfused, no signs of DVT  Assessment:    Normal postpartum exam. Pap smear done at today's visit.   Plan:   Essential components of care per ACOG recommendations for Comprehensive Postpartum exam:  1.  Mood and well being: Patient with positive depression screening today. Reviewed local resources for support. EPDS is moderate risk. Reviewed resources and that mood sx in first year after pregnancy are considered related to pregnancy and to reach out for help at [facility] if needed. Pt declines LCSW for now. Discussed [facility]  as link to care and availability of LCSW for counseling. - Patient does not use tobacco.  - hx of drug use? No -When partner leaves the room we discussed her concern for partner's excessive drinking. She declines LCSW referral but accepts AM card to call if desired. Handout on alcohol treatment centers given to pt by RN. Advised to RTC any time for connection w/LCSW.  2. Infant care and feeding:  -Patient currently breastmilk feeding? yes  If breastmilk feeding discussed return to work and pumping. If needed, patient was provided letter for work to allow for every 2-3 hr pumping breaks, and to be granted a private location to express breastmilk and refrigerated area to store breastmilk. Reviewed importance of draining breast regularly to support lactation. I  -Recommended patient engage with WIC/BFpeer counselors  -Counseled to sign new child up for [facility] services -Social determinants of health (SDOH) reviewed in EPIC. No concerns aside from partner's drinking (see #1)  3. Sexuality, contraception and birth spacing -Contraception: Contraception counseling: Reviewed all forms of birth control options in the tiered based approach. available including abstinence; over the counter/barrier methods; hormonal contraceptive medication including pill, patch, ring, injection,contraceptive implant; hormonal and nonhormonal IUDs; permanent sterilization options including vasectomy and the various tubal sterilization modalities. Risks, benefits, and typical effectiveness rates were reviewed.  Questions were answered.  Written information was also given to the patient to review.  Patient desires depo, this was prescribed for patient. She will follow up in  6 wks for next injection.  She was told to call with any further questions, or with any concerns about this method of contraception.  Emphasized use of condoms 100% of the time for STI prevention. -ECP: n/a - Patient does not want a pregnancy in the next year.   Desired family size is 1 children.  - Reviewed forms of contraception in tiered fashion. Patient desired depo today.   - Discussed birth spacing of 18 months  4. Sleep and fatigue -Encouraged family/partner/community support of 4 hrs of uninterrupted sleep to help with mood and fatigue  5. Physical Recovery  - Discussed patients delivery and complications - Patient had a 2nd degree laceration, perineal healing reviewed. Patient expressed understanding. - Patient has urinary incontinence? no - Patient is safe to resume physical and sexual activity  6.  Health Maintenance/Chronic Disease - Last pap smear performed today. -Screening mammograms will be due age 79.  28. Postpartum exam - Hemoglobin, venipuncture - medroxyPROGESTERone (DEPO-PROVERA) injection 150 mg - future order for 5-6 wks from now - Pap IG (Image Guided) - WET PREP FOR [place], YEAST, CLUE - Chlamydia/Gonorrhea [place] [facility] - HIV [place] [facility] - Syphilis Serology,  Lab - Multiple Vitamins-Minerals (MULTIVITAMIN) tablet; Take 1 tablet by mouth daily.  Dispense: 100 tablet; Refill: 0   Patient given handout about PCP care in the community Given MVI per family planning program guidelines and availability  Follow up in 5-6 wks for depo or as needed.

## 2019-06-11 LAB — PAP IG (IMAGE GUIDED): PAP Smear Comment: 0

## 2019-10-30 ENCOUNTER — Encounter: Payer: Self-pay | Admitting: Physician Assistant

## 2019-10-30 ENCOUNTER — Other Ambulatory Visit: Payer: Self-pay

## 2019-10-30 ENCOUNTER — Ambulatory Visit (LOCAL_COMMUNITY_HEALTH_CENTER): Payer: Self-pay | Admitting: Physician Assistant

## 2019-10-30 VITALS — BP 114/67 | Wt 149.6 lb

## 2019-10-30 DIAGNOSIS — Z3009 Encounter for other general counseling and advice on contraception: Secondary | ICD-10-CM

## 2019-10-30 NOTE — Progress Notes (Signed)
   [facility] problem [facility]  [facility]  Subjective:  Amber Potter is a 19 y.o. being seen today for family planning visit/eval of irregular vaginal bleeding.  Chief Complaint  Patient presents with  . Menstrual Problem    19 yo G1 P1001 here for eval of menstual irregularity. Had baby last winter, and started DMPA at postpartum visit on 04/23/19. Did not keep 3 mo f/u visit due to transportation issues. Had 2 periods in Sept and 2 episodes of vaginal bleeding in Oct, with current episode starting 4 days ago. Has some associated cramping, which improves with Tylenol. Sexually active without condoms.     Does the patient have a current or past history of drug use? No   No components found for: HCV]   Health Maintenance Due  Topic Date Due  . Hepatitis C Screening  Never done  . COVID-19 Vaccine (1) Never done  . INFLUENZA VACCINE  08/17/2019    Review of Systems  Constitutional: Negative.   Respiratory: Negative.   Cardiovascular: Negative.   Gastrointestinal: Negative.   Genitourinary: Negative.   Skin: Negative.   Endo/Heme/Allergies: Negative.     The following portions of the patient's history were reviewed and updated as appropriate: allergies, current medications, past family history, past medical history, past social history, past surgical history and problem list. Problem list updated.   See flowsheet for other program required questions.  Objective:   Vitals:   10/30/19 1547  BP: 114/67  Weight: 149 lb 9.6 oz (67.9 kg)    Physical Exam Constitutional:      Appearance: Normal appearance. She is normal weight.  Neurological:     Mental Status: She is alert.   No further physical exam performed.   Assessment and Plan:  [name] is a 19 y.o. female presenting to the [facility] Department for a Women's Health problem visit  1. Family planning services Most likely having menstrual  irregularities due to waning DMPA. Extensive discussion of BCM, and pt desires just condoms for now. Wants next baby in 2 years. Enc to return to clinic if she wants more statistically reliable BCM. Continue MVI with folic acid qd.     Return if symptoms worsen or fail to improve.  No future appointments.  Landry Dyke, PA-C

## 2019-10-30 NOTE — Progress Notes (Signed)
Patient here c/o heavy bleeding of 4 days. Had Depo at hospital after giving birth on 04/23/19, missed next Depo in July 2021. Now about 20 weeks 3 days since Depo.Burt Knack, RN

## 2020-01-17 NOTE — L&D Delivery Note (Signed)
Delivery Note  Amber Potter is a G2P1001 at [redacted]w[redacted]d with an LMP of 11/21/2019, not consistent with [place] at [redacted]w[redacted]d.   First Stage: Labor onset: 1300 09/20/2020 Augmentation: AROM Analgesia Eliezer Lofts intrapartum: Epidural AROM at 0351 for thick meconium stained fluid   Second Stage: Complete dilation at 0552 Onset of pushing at 0603 FHR second stage 130bpm with moderate variability and accels  Amber Potter presented to L&D with regular painful contractions.  She received an epidural and then was augmented with AROM after the 2nd dose of ampicillin for GBS prophylaxis.  She progressed spontaneously after AROM to C/C/+3 and pushed quickly over 1 contraction for a spontaneous birth. Delivery of a viable baby girl on 09/21/2020 at 0604 by CNM Delivery of fetal head in ROA position with quick restitution to ROT. No nuchal cord;  Anterior then posterior shoulders delivered easily with gentle downward traction. Baby placed on mom's chest, and attended to by baby RN Cord double clamped after cessation of pulsation, cut by CNM.  Amber Potter and partner declined to cut the cord.    Cord blood sample collected: O pos   Third Stage: Oxytocin bolus started after delivery of infant for hemorrhage prophylaxis  Placenta delivered intact with 3 VC @ 0612 Placenta disposition: discarded  Uterine tone firm / bleeding minimal   1st perineal laceration identified - hemostatic and approximates well Anesthesia for repair: N/A Repair not indicated  Est. Blood Loss (mL): 69ml  Complications: none  Mom to postpartum.  Baby to Couplet care / Skin to Skin.  Newborn: Information for the patient's newborn:  [name], Girl [name]  Live born female  Birth Weight: 6 lb 6.3 oz (2900 g) APGAR: 8, 9  Newborn Delivery   Birth date/time: 09/21/2020 06:03:00 Delivery type: Vaginal, Spontaneous     Feeding planned: breast   ---------- Margaretmary Eddy, CNM Certified Nurse Midwife [facility]  [facility]

## 2020-02-04 ENCOUNTER — Other Ambulatory Visit: Payer: Self-pay

## 2020-02-04 ENCOUNTER — Ambulatory Visit (LOCAL_COMMUNITY_HEALTH_CENTER): Payer: Self-pay

## 2020-02-04 VITALS — BP 114/72 | Wt 140.0 lb

## 2020-02-04 DIAGNOSIS — Z3201 Encounter for pregnancy test, result positive: Secondary | ICD-10-CM

## 2020-02-04 LAB — PREGNANCY, URINE: Preg Test, Ur: POSITIVE — AB

## 2020-02-04 MED ORDER — PRENATAL 27-0.8 MG PO TABS: 1.0000 | ORAL_TABLET | Freq: Every day | ORAL | 0 refills | Status: DC

## 2020-02-04 NOTE — Progress Notes (Signed)
UPT positive today. Plans prenatal care at [facility]. To clerk for preadmit. M. Yemen, interpreter. Jerel Shepherd, RN

## 2020-02-11 ENCOUNTER — Other Ambulatory Visit: Payer: Self-pay | Admitting: Family Medicine

## 2020-02-11 ENCOUNTER — Encounter: Payer: Self-pay | Admitting: Family Medicine

## 2020-02-11 ENCOUNTER — Other Ambulatory Visit: Payer: Self-pay

## 2020-02-11 ENCOUNTER — Ambulatory Visit: Payer: Medicaid Other | Admitting: Family Medicine

## 2020-02-11 VITALS — BP 113/68 | HR 63 | Temp 97.4°F | Ht 60.0 in | Wt 138.6 lb

## 2020-02-11 DIAGNOSIS — Z8709 Personal history of other diseases of the respiratory system: Secondary | ICD-10-CM | POA: Diagnosis not present

## 2020-02-11 DIAGNOSIS — Z3480 Encounter for supervision of other normal pregnancy, unspecified trimester: Secondary | ICD-10-CM | POA: Diagnosis not present

## 2020-02-11 DIAGNOSIS — Z8659 Personal history of other mental and behavioral disorders: Secondary | ICD-10-CM

## 2020-02-11 DIAGNOSIS — Z23 Encounter for immunization: Secondary | ICD-10-CM

## 2020-02-11 LAB — WET PREP FOR TRICH, YEAST, CLUE
Trichomonas Exam: NEGATIVE
Yeast Exam: NEGATIVE

## 2020-02-11 LAB — URINALYSIS
Bilirubin, UA: NEGATIVE
Glucose, UA: NEGATIVE
Ketones, UA: NEGATIVE
Leukocytes,UA: NEGATIVE
Nitrite, UA: NEGATIVE
Protein,UA: NEGATIVE
RBC, UA: NEGATIVE
Specific Gravity, UA: 1.025 (ref 1.005–1.030)
Urobilinogen, Ur: 0.2 mg/dL (ref 0.2–1.0)
pH, UA: 6 (ref 5.0–7.5)

## 2020-02-11 LAB — HEMOGLOBIN, FINGERSTICK: Hemoglobin: 12.6 g/dL (ref 11.1–15.9)

## 2020-02-11 NOTE — Progress Notes (Signed)
[facility] HEALTH DEPT [facility] [address] [place] [address] [phone]  INITIAL PRENATAL VISIT NOTE  Subjective:  Amber Potter is a 20 y.o. G2P1001 at [redacted]w[redacted]d being seen today to start prenatal care at the [facility] Department.  She is currently monitored for the following issues for this high-risk pregnancy and has Major depressive disorder, single episode, moderate (HCC); Asthma; Illiteracy; Supervision of other normal pregnancy, antepartum; History of asthma; and History of depression on their problem list.  Patient reports nausea.  Contractions: Not present. Vag. Bleeding: None.  Movement: Absent. Denies leaking of fluid.   Indications for ASA therapy (per uptodate) One of the following: Previous pregnancy with preeclampsia, especially early onset and with an adverse outcome No Multifetal gestation No Chronic hypertension No Type 1 or 2 diabetes mellitus No Chronic kidney disease No Autoimmune disease (antiphospholipid syndrome, systemic lupus erythematosus) No  Two or more of the following: Nulliparity No Obesity (body mass index >30 kg/m2) No Family history of preeclampsia in mother or sister No Age ?35 years No Sociodemographic characteristics (African American race, low socioeconomic level) Yes Personal risk factors (eg, previous pregnancy with low birth weight or small for gestational age infant, previous adverse pregnancy outcome [eg, stillbirth], interval >10 years between pregnancies) No   The following portions of the patient's history were reviewed and updated as appropriate: allergies, current medications, past family history, past medical history, past social history, past surgical history and problem list. Problem list updated.  Objective:   Vitals:   02/11/20 0913 02/11/20 0917  BP: 113/68   Pulse: 63   Temp: (!) 97.4 F (36.3 C)   Weight: 138 lb 9.6 oz (62.9 kg)   Height:  5'  (1.524 m)    Fetal Status: Fetal Heart Rate (bpm): not heard  Fundal Height: 11 cm Movement: Absent       Physical Exam Vitals and nursing note reviewed.  Constitutional:      General: She is not in acute distress.    Appearance: Normal appearance. She is well-developed.  HENT:     Head: Normocephalic and atraumatic.     Right Ear: External ear normal.     Left Ear: External ear normal.     Nose: Nose normal. No congestion or rhinorrhea.     Mouth/Throat:     Lips: Pink.     Mouth: Mucous membranes are moist.     Dentition: Normal dentition. No dental caries.     Pharynx: Oropharynx is clear. Uvula midline.  Eyes:     General: No scleral icterus.    Conjunctiva/sclera: Conjunctivae normal.  Neck:     Thyroid: No thyroid mass or thyromegaly.     Comments: Thyroid normal  Cardiovascular:     Rate and Rhythm: Normal rate.     Pulses: Normal pulses.     Comments: Extremities are warm and well perfused Pulmonary:     Effort: Pulmonary effort is normal.     Breath sounds: Normal breath sounds.  Chest:     Chest wall: No mass.  Breasts:     Tanner Score is 5. Breasts are symmetrical.     Right: Normal. No mass, nipple discharge, skin change or axillary adenopathy.     Left: Normal. No mass, nipple discharge, skin change or axillary adenopathy.    Abdominal:     General: Abdomen is flat.     Palpations: Abdomen is soft.     Tenderness: There is  no abdominal tenderness.     Comments: Gravid   Genitourinary:    General: Normal vulva.     Exam position: Lithotomy position.     Pubic Area: No rash.      Labia:        Right: No rash.        Left: No rash.      Vagina: Normal. No vaginal discharge.     Cervix: No cervical motion tenderness or friability.     Uterus: Normal. Enlarged (Gravid 11 size). Not tender.      Adnexa: Right adnexa normal and left adnexa normal.     Rectum: Normal. No external hemorrhoid.  Musculoskeletal:     Cervical back: Normal range of motion  and neck supple.     Right lower leg: No edema.     Left lower leg: No edema.  Lymphadenopathy:     Upper Body:     Right upper body: No axillary adenopathy.     Left upper body: No axillary adenopathy.  Skin:    General: Skin is warm.     Capillary Refill: Capillary refill takes less than 2 seconds.  Neurological:     Mental Status: She is alert.     Assessment and Plan:  Pregnancy: G2P1001 at [redacted]w[redacted]d  There are no diagnoses linked to this encounter.   Discussed overview of care and coordination with inpatient delivery practices including [facility], [facility], [facility] and [facility].   Patient reports that she was not planning to get pregnant this soon, but she did not return for depo provera after PP visit.  Reports that she lives in [place] with daughter and sister and that father of the baby Amber Potter (40 yrs) lives with his mother.   She is not currently working, her and FOB plans on living together and he is supportive of her and their daughter.   Patient reports  Nausea happens in the am only eats one time a day, eats at 11 am. Has some nausea in the afternoons around 4 pm .   Discussed dietary suggestions including small, frequent meals with protein and OTC remedies including ginger, Sea[employer]ands and vitamin B6.  Advised to let us know if sx persist or worsen.   Discussed wic- encouraged to sign up .    Denies any visits for the ER with this pregnancy and is sure the LMP was 11/21/2019 and was normal.  Bleeding? Contractions? None   History Asthma (peaks flow done today)  Depression. -stopped visits with AMariana Kaufman.    H Denies physical and sexual abuse.  Partner had hx of ETOH abuse  any problems with partner drinking patient reports that he has stopped drinking.  Patient denies use of any substances, ETOH or tobacco.    Medications? None  Allergies? None  Last pap: 06/09/2019 Last dentist: never   Denies hx of PTL or LBW infant (7 lbs).  Last delivery was vaginal with  no complications per patient.    Denies family history of  genetic abnormalities and accepts genetic tests  Patient plans [facility].    [facility]   PHQ-9 results: 2  Covid vaccine: 1/19/202  Discussed with patient expectation of visits   Preterm labor symptoms and general obstetric precautions including but not limited to vaginal bleeding, contractions, leaking of fluid and fetal movement were reviewed in detail with the patient.  Please refer to After Visit Summary for other counseling recommendations.   No  follow-ups on file.  Future Appointments  Date Time Provider Department Center  03/03/2020  8:40 AM [facility] PROVIDER [facility] None   Spanish interpreter  V. Olmedo used  Wendi Snipes, FNP

## 2020-02-11 NOTE — Progress Notes (Signed)
Presents for MH IP at 11.5 weeks. Take PNV daily. Denies ED/Hospital visit since + PT. Hgb reviewed with provider, no treatment indicated per standing order. New OB pack given, counseled. Sharlyne Pacas, RN

## 2020-02-12 DIAGNOSIS — Z8659 Personal history of other mental and behavioral disorders: Secondary | ICD-10-CM | POA: Insufficient documentation

## 2020-02-12 DIAGNOSIS — Z8709 Personal history of other diseases of the respiratory system: Secondary | ICD-10-CM | POA: Insufficient documentation

## 2020-02-12 DIAGNOSIS — Z3480 Encounter for supervision of other normal pregnancy, unspecified trimester: Secondary | ICD-10-CM | POA: Insufficient documentation

## 2020-02-12 LAB — HIV-1/HIV-2 QUALITATIVE RNA
HIV-1 RNA, Qualitative: NONREACTIVE
HIV-2 RNA, Qualitative: NONREACTIVE

## 2020-02-12 LAB — CBC/D/PLT+RPR+RH+ABO+AB SCR
Antibody Screen: NEGATIVE
Basophils Absolute: 0.1 10*3/uL (ref 0.0–0.2)
Basos: 0 %
EOS (ABSOLUTE): 0.3 10*3/uL (ref 0.0–0.4)
Eos: 3 %
Hematocrit: 40.5 % (ref 34.0–46.6)
Hemoglobin: 12.7 g/dL (ref 11.1–15.9)
Hepatitis B Surface Ag: NEGATIVE
Immature Grans (Abs): 0 10*3/uL (ref 0.0–0.1)
Immature Granulocytes: 0 %
Lymphocytes Absolute: 3.3 10*3/uL — ABNORMAL HIGH (ref 0.7–3.1)
Lymphs: 30 %
MCH: 26 pg — ABNORMAL LOW (ref 26.6–33.0)
MCHC: 31.4 g/dL — ABNORMAL LOW (ref 31.5–35.7)
MCV: 83 fL (ref 79–97)
Monocytes Absolute: 0.6 10*3/uL (ref 0.1–0.9)
Monocytes: 5 %
Neutrophils Absolute: 7 10*3/uL (ref 1.4–7.0)
Neutrophils: 62 %
Platelets: 380 10*3/uL (ref 150–450)
RBC: 4.89 x10E6/uL (ref 3.77–5.28)
RDW: 13.2 % (ref 11.7–15.4)
RPR Ser Ql: NONREACTIVE
Rh Factor: POSITIVE
WBC: 11.2 10*3/uL — ABNORMAL HIGH (ref 3.4–10.8)

## 2020-02-12 LAB — HCV AB W REFLEX TO QUANT PCR: HCV Ab: <0.1 s/co ratio (ref 0.0–0.9)

## 2020-02-12 LAB — HCV INTERPRETATION

## 2020-02-13 LAB — 789231 7+OXYCODONE-BUND
Amphetamines, Urine: NEGATIVE ng/mL
BENZODIAZ UR QL: NEGATIVE ng/mL
Barbiturate screen, urine: NEGATIVE ng/mL
Cannabinoid Quant, Ur: NEGATIVE ng/mL
Cocaine (Metab.): NEGATIVE ng/mL
OPIATE SCREEN URINE: NEGATIVE ng/mL
Oxycodone/Oxymorphone, Urine: NEGATIVE ng/mL
PCP Quant, Ur: NEGATIVE ng/mL

## 2020-02-13 LAB — URINE CULTURE

## 2020-02-14 LAB — CHLAMYDIA/GC NAA, CONFIRMATION
Chlamydia trachomatis, NAA: NEGATIVE
Neisseria gonorrhoeae, NAA: NEGATIVE

## 2020-02-16 ENCOUNTER — Other Ambulatory Visit: Payer: Self-pay | Admitting: Family Medicine

## 2020-02-16 ENCOUNTER — Telehealth: Payer: Self-pay

## 2020-02-16 DIAGNOSIS — Z369 Encounter for antenatal screening, unspecified: Secondary | ICD-10-CM

## 2020-02-16 NOTE — Telephone Encounter (Signed)
Call to [facility] scheduler, to ascertain if appt scheduled for client as referral faxed on 02/11/2020 (confirmation received). Per Britta Mccreedy, she does not have referral and other scheduler Marylu Lund) has not yet arrived to work. Per Britta Mccreedy, she will investigate and call us back. Jossie Ng, RN

## 2020-02-16 NOTE — Telephone Encounter (Signed)
[employer], ID # [id number], utilized to notify client of her first trimester screening appt. Jossie Ng, RN

## 2020-02-16 NOTE — Telephone Encounter (Signed)
Return call from [facility] and first trimester screening appt scheduled for 02/19/2020 at  1 pm ([place]) and 2 pm (genetic counseling). Call to client with above appt.and verbal directions provided to facility. Jossie Ng, RN

## 2020-02-19 ENCOUNTER — Other Ambulatory Visit: Payer: Self-pay

## 2020-02-19 ENCOUNTER — Ambulatory Visit: Payer: Medicaid Other | Attending: Family Medicine

## 2020-02-19 ENCOUNTER — Ambulatory Visit: Payer: Self-pay

## 2020-02-19 ENCOUNTER — Other Ambulatory Visit: Payer: Self-pay | Admitting: Family Medicine

## 2020-02-19 DIAGNOSIS — Z369 Encounter for antenatal screening, unspecified: Secondary | ICD-10-CM | POA: Diagnosis not present

## 2020-02-19 DIAGNOSIS — Z3A09 9 weeks gestation of pregnancy: Secondary | ICD-10-CM | POA: Insufficient documentation

## 2020-02-19 DIAGNOSIS — Z3687 Encounter for antenatal screening for uncertain dates: Secondary | ICD-10-CM | POA: Insufficient documentation

## 2020-02-19 NOTE — Progress Notes (Signed)
Amber Potter Length of Consultation: 45 minutes   [name]  was referred to [facility] at [facility] for genetic counseling to review prenatal screening and testing options.  This note summarizes the information we discussed with the aid of a Spanish interpreter.    We offered the following routine screening tests for this pregnancy:  Cell free fetal DNA testing from maternal blood may be used to determine whether a baby is at high risk for Down syndrome, trisomy 49, or trisomy 43.  This test utilizes a maternal blood sample and DNA sequencing technology to isolate circulating cell free fetal DNA from maternal plasma.  The fetal DNA can then be analyzed for DNA sequences that are derived from the three most common chromosomes involved in aneuploidy, chromosomes 13, 18, and 21.  If the overall amount of DNA is greater than the expected level for any of these chromosomes, aneuploidy is suspected.  The detection rate for Down syndrome and trisomy 18 is >99% and the detection rate for trisomy 13 is >91%. While we do not consider it a replacement for invasive testing and karyotype analysis, a negative result from this testing would be reassuring, though not a guarantee of a normal chromosome complement for the baby.  An abnormal result is certainly suggestive of an abnormal chromosome complement, though we would still recommend CVS or amniocentesis to confirm any findings from this testing. This testing can also assess for the sex chromosomes and can detect approximately 96% of sex chromosome aneuploidies and determine fetal gender with >99% confidence.    First trimester screening, which includes nuchal translucency ultrasound screen and first trimester maternal serum marker screening.  The nuchal translucency has approximately an 80% detection rate for Down syndrome and can be positive for other chromosome abnormalities as well as congenital heart defects.  When combined with a  maternal serum marker screening, the detection rate is up to 90% for Down syndrome and up to 97% for trisomy 18.     Maternal serum marker screening, a blood test that measures pregnancy proteins, can provide risk assessments for Down syndrome, trisomy 18, and open neural tube defects (spina bifida, anencephaly). Because it does not directly examine the fetus, it cannot positively diagnose or rule out these problems.  Targeted ultrasound uses high frequency sound waves to create an image of the developing fetus.  An ultrasound is often recommended as a routine means of evaluating the pregnancy.  It is also used to screen for fetal anatomy problems (for example, a heart defect) that might be suggestive of a chromosomal or other abnormality.   Should these screening tests indicate an increased concern, then the following additional testing options would be offered:  The chorionic villus sampling procedure is available for first trimester chromosome analysis.  This involves the withdrawal of a small amount of chorionic villi (tissue from the developing placenta).  Risk of pregnancy loss is estimated to be approximately 1 in 200 to 1 in 100 (0.5 to 1%).  There is approximately a 1% (1 in 100) chance that the CVS chromosome results will be unclear.  Chorionic villi cannot be tested for neural tube defects.     Amniocentesis involves the removal of a small amount of amniotic fluid from the sac surrounding the fetus with the use of a thin needle inserted through the maternal abdomen and uterus.  Ultrasound guidance is used throughout the procedure.  Fetal cells from amniotic fluid are directly evaluated and > 99.5% of chromosome  problems and > 98% of open neural tube defects can be detected. This procedure is generally performed after the 15th week of pregnancy.  The main risks to this procedure include complications leading to miscarriage in less than 1 in 200 cases (0.5%).  Cystic Fibrosis and Spinal Muscular  Atrophy (SMA) screening were also discussed with the patient. Both conditions are recessive, which means that both parents must be carriers in order to have a child with the disease.  Cystic fibrosis (CF) is one of the most common genetic conditions in persons of Caucasian ancestry.  This condition occurs in approximately 1 in 2,500 Caucasian persons and results in thickened secretions in the lungs, digestive, and reproductive systems.  For a baby to be at risk for having CF, both of the parents must be carriers for this condition.  Approximately 1 in 14 Caucasian persons is a carrier for CF.  Current carrier testing looks for the most common mutations in the gene for CF and can detect approximately 90% of carriers in the Caucasian population.  This means that the carrier screening can greatly reduce, but cannot eliminate, the chance for an individual to have a child with CF.  If an individual is found to be a carrier for CF, then carrier testing would be available for the partner. As part of [place]'s newborn screening profile, all babies born in the state of [place] will have a two-tier screening process.  Specimens are first tested to determine the concentration of immunoreactive trypsinogen (IRT).  The top 5% of specimens with the highest IRT values then undergo DNA testing using a panel of over 40 common CF mutations. SMA is a neurodegenerative disorder that leads to atrophy of skeletal muscle and overall weakness.  This condition is also more prevalent in the Caucasian population, with 1 in 40-1 in 60 persons being a carrier and 1 in 6,000-1 in 10,000 children being affected.  There are multiple forms of the disease, with some causing death in infancy to other forms with survival into adulthood.  The genetics of SMA is complex, but carrier screening can detect up to 95% of carriers in the Caucasian population.  Similar to CF, a negative result can greatly reduce, but cannot eliminate, the chance  to have a child with SMA. Hemoglobinopathy screening was also made available. Hemoglobinopathy screening was also made available.  We obtained a detailed family history and pregnancy history.  The family history was reported to be unremarkable for birth defects, intellectual delays, recurrent pregnancy loss or known chromosome abnormalities.  [name] stated that this is the second pregnancy for she and her partner.  They have a healthy 77 month old son.  She reported no complications or exposures in the pregnancy that would be expected to increase the risk for birth defects.  After consideration of the options, [name] elected to have cell free fetal DNA testing and to decline carrier screening. An ultrasound was performed at the time of the visit.  The gestational age was consistent with 9 weeks.  Fetal anatomy could not be assessed due to early gestational age.  Please refer to the ultrasound report for details of that study. Given the early gestational age, the patient will return in 3 weeks for labs and 9 weeks for an anatomy ultrasound.  [name] was encouraged to call with questions or concerns.  We can be contacted at [phone].  Plan of care: Marland Kitchen Return to [facility] for lab only visit  for MaterniT21 PLUS with [facility] in 3 weeks. . Scheduled to return to [facility] in 9 weeks for second trimester anatomy ultrasound. . Declined carrier screening for CF/SMA/hemoglobinopathies   Labs ordered: none today  Cherly Anderson, MS, CGC

## 2020-02-25 ENCOUNTER — Encounter: Payer: Self-pay | Admitting: Family Medicine

## 2020-02-25 DIAGNOSIS — Z3480 Encounter for supervision of other normal pregnancy, unspecified trimester: Secondary | ICD-10-CM

## 2020-03-02 ENCOUNTER — Telehealth: Payer: Self-pay

## 2020-03-02 NOTE — Telephone Encounter (Signed)
TC to patient cell number and talked with Amber Potter who states patient at home. TC to patient at home number and patient appointment rescheduled due to building maintenance. [address], [place] # [id number] .Burt Knack, RN

## 2020-03-03 ENCOUNTER — Ambulatory Visit: Payer: Self-pay

## 2020-03-04 ENCOUNTER — Ambulatory Visit: Payer: Self-pay

## 2020-03-09 ENCOUNTER — Ambulatory Visit: Payer: Self-pay

## 2020-03-10 NOTE — Addendum Note (Signed)
Addended by: Heywood Bene on: 03/10/2020 01:27 PM   Modules accepted: Orders

## 2020-03-12 ENCOUNTER — Other Ambulatory Visit: Payer: Self-pay

## 2020-03-12 ENCOUNTER — Ambulatory Visit: Payer: Medicaid Other | Admitting: Advanced Practice Midwife

## 2020-03-12 VITALS — BP 106/71 | HR 73 | Temp 98.6°F | Wt 134.0 lb

## 2020-03-12 DIAGNOSIS — Z3480 Encounter for supervision of other normal pregnancy, unspecified trimester: Secondary | ICD-10-CM | POA: Diagnosis not present

## 2020-03-12 DIAGNOSIS — F321 Major depressive disorder, single episode, moderate: Secondary | ICD-10-CM

## 2020-03-12 DIAGNOSIS — J45909 Unspecified asthma, uncomplicated: Secondary | ICD-10-CM

## 2020-03-12 DIAGNOSIS — Z8659 Personal history of other mental and behavioral disorders: Secondary | ICD-10-CM

## 2020-03-12 LAB — URINALYSIS, MICROSCOPIC ONLY
Casts: NONE SEEN /lpf
Crystals: NONE SEEN
Trichomonas, UA: NONE SEEN
Yeast, UA: NONE SEEN

## 2020-03-12 LAB — URINALYSIS
Bilirubin, UA: NEGATIVE
Glucose, UA: NEGATIVE
Leukocytes,UA: NEGATIVE
Nitrite, UA: NEGATIVE
Protein,UA: NEGATIVE
RBC, UA: NEGATIVE
Specific Gravity, UA: 1.03 (ref 1.005–1.030)
Urobilinogen, Ur: 0.2 mg/dL (ref 0.2–1.0)
pH, UA: 5.5 (ref 5.0–7.5)

## 2020-03-12 NOTE — Progress Notes (Signed)
   PRENATAL VISIT NOTE  Subjective:  Amber Potter is a 20 y.o. G2P1001 at [redacted]w[redacted]d being seen today for ongoing prenatal care.  She is currently monitored for the following issues for this low-risk pregnancy and has Major depressive disorder, single episode, moderate (HCC); Asthma; Illiteracy; Supervision of other normal pregnancy, antepartum; History of asthma; and History of depression on their problem list.  Patient reports daily N&V.  Contractions: Not present. Vag. Bleeding: None.  Movement: Absent. Denies leaking of fluid/ROM.   The following portions of the patient's history were reviewed and updated as appropriate: allergies, current medications, past family history, past medical history, past social history, past surgical history and problem list. Problem list updated.  Objective:   Vitals:   03/12/20 1107  BP: 106/71  Pulse: 73  Temp: 98.6 F (37 C)  Weight: 134 lb (60.8 kg)    Fetal Status: Fetal Heart Rate (bpm): 160 Fundal Height: 12 cm Movement: Absent     General:  Alert, oriented and cooperative. Patient is in no acute distress.  Skin: Skin is warm and dry. No rash noted.   Cardiovascular: Normal heart rate noted  Respiratory: Normal respiratory effort, no problems with respiration noted  Abdomen: Soft, gravid, appropriate for gestational age.  Pain/Pressure: Absent     Pelvic: Cervical exam deferred        Extremities: Normal range of motion.  Edema: None  Mental Status: Normal mood and affect. Normal behavior. Normal judgment and thought content.   Assessment and Plan:  Pregnancy: G2P1001 at [redacted]w[redacted]d  1. Supervision of other normal pregnancy, antepartum C/o daily N&V with 4 lb wt loss in 4 wks. Vomited yesterday x3, day before 3x as well. Counseled extensively on eating q2 hours high protein snack, list given, encouraged vit B6, ginger tea, Phenergan prn. U/a today--tr ketones Had u/s 02/19/20 with anterior placenta at 9.0wks with change in EDC=09/23/20 Pt was  scheduled for FIRST screen yesterday but "forgot"--she is undecided now but will call if still wants Not working Anatomy u/s 04/22/20 - Urinalysis (Urine Dip) - Urine Microscopic  2. Asthma, unspecified asthma severity, unspecified whether complicated, unspecified whether persistent Denies sxs or need for Albuterol  3. History of depression +daily crying, +moody, irritable, sleep wnl, poor appetite; pt wants to "think about counseling"--please give contact card for Kathreen Cosier, LCSW   Preterm labor symptoms and general obstetric precautions including but not limited to vaginal bleeding, contractions, leaking of fluid and fetal movement were reviewed in detail with the patient. Please refer to After Visit Summary for other counseling recommendations.  No follow-ups on file.  Future Appointments  Date Time Provider Department Center  04/22/2020  8:00 AM [facility]    Alberteen Spindle, CNM

## 2020-03-12 NOTE — Progress Notes (Signed)
Patient here for MH RV at 12 1/7. C/O nausea and vomiting, and has lost about 4 pounds since last visit. Burt Knack, RN

## 2020-03-12 NOTE — Progress Notes (Signed)
Urine dip reviewed by provider and patient scheduled to return to clinic in 2 weeks. Patient walked to covid vaccine clinic for 2nd dose of vaccine today.Burt Knack, RN

## 2020-03-25 ENCOUNTER — Telehealth: Payer: Self-pay

## 2020-03-25 NOTE — Telephone Encounter (Signed)
Due to air testing in [facility] tomorrow, client's am appt change to 1520 with arrival time of 1500 and client agreeable with plan. Jossie Ng, RN

## 2020-03-26 ENCOUNTER — Ambulatory Visit: Payer: Self-pay | Admitting: Advanced Practice Midwife

## 2020-03-26 ENCOUNTER — Ambulatory Visit: Payer: Self-pay

## 2020-03-26 ENCOUNTER — Other Ambulatory Visit: Payer: Self-pay

## 2020-03-26 VITALS — BP 96/61 | HR 69 | Temp 98.9°F | Wt 133.2 lb

## 2020-03-26 DIAGNOSIS — Z8709 Personal history of other diseases of the respiratory system: Secondary | ICD-10-CM

## 2020-03-26 DIAGNOSIS — Z8659 Personal history of other mental and behavioral disorders: Secondary | ICD-10-CM

## 2020-03-26 DIAGNOSIS — Z3480 Encounter for supervision of other normal pregnancy, unspecified trimester: Secondary | ICD-10-CM

## 2020-03-26 LAB — URINALYSIS
Bilirubin, UA: NEGATIVE
Glucose, UA: NEGATIVE
Nitrite, UA: NEGATIVE
Protein,UA: NEGATIVE
RBC, UA: NEGATIVE
Specific Gravity, UA: 1.03 (ref 1.005–1.030)
Urobilinogen, Ur: 2 mg/dL — ABNORMAL HIGH (ref 0.2–1.0)
pH, UA: 6 (ref 5.0–7.5)

## 2020-03-26 NOTE — Progress Notes (Signed)
Patient here for MH RV at 14 1/7. Patient is past time for 1st trimester screening. Will offer Quad screen appointment at next visit. Patient counseled on option to come at 16 weeks for quad screen appointment and patient states she will have quad at her next regular MH appointment.Burt Knack, RN

## 2020-03-26 NOTE — Progress Notes (Addendum)
   PRENATAL VISIT NOTE  Subjective:  Amber Potter is a 20 y.o. G2P1001 at [redacted]w[redacted]d being seen today for ongoing prenatal care.  She is currently monitored for the following issues for this low-risk pregnancy and has Major depressive disorder, single episode, moderate (HCC); Asthma; Illiteracy; Supervision of other normal pregnancy, antepartum; History of asthma; and History of depression on their problem list.  Patient reports no complaints.  Contractions: Not present. Vag. Bleeding: None.  Movement: Absent. Denies leaking of fluid/ROM.   The following portions of the patient's history were reviewed and updated as appropriate: allergies, current medications, past family history, past medical history, past social history, past surgical history and problem list. Problem list updated.  Objective:   Vitals:   03/26/20 1525  BP: 96/61  Pulse: 69  Temp: 98.9 F (37.2 C)  Weight: 133 lb 3.2 oz (60.4 kg)    Fetal Status: Fetal Heart Rate (bpm): 150 Fundal Height: 14 cm Movement: Absent     General:  Alert, oriented and cooperative. Patient is in no acute distress.  Skin: Skin is warm and dry. No rash noted.   Cardiovascular: Normal heart rate noted  Respiratory: Normal respiratory effort, no problems with respiration noted  Abdomen: Soft, gravid, appropriate for gestational age.  Pain/Pressure: Absent     Pelvic: Cervical exam deferred        Extremities: Normal range of motion.  Edema: None  Mental Status: Normal mood and affect. Normal behavior. Normal judgment and thought content.   Assessment and Plan:  Pregnancy: G2P1001 at [redacted]w[redacted]d  1. Supervision of other normal pregnancy, antepartum N&V not as frequent now--last vomited 03/15/20; u/a today--trace ketones Breakfast: 2 plantains, beans, cheese, Sprite Lunch: banana, mandarin Reviewed 02/19/20 u/s with anterior placenta at 9.0 wks Pt missed FIRST screen on 03/11/20 and says will do Quad screen at next apt Not working 13 lb 3.2  oz (5.987 kg)  - Urinalysis (Urine Dip)  2. History of depression Denies sxs and states has no one to watch her child so she can see Marchelle Folks so refuses apt  3. History of asthma Has used Albuteral x1 in 02/2020   Preterm labor symptoms and general obstetric precautions including but not limited to vaginal bleeding, contractions, leaking of fluid and fetal movement were reviewed in detail with the patient. Please refer to After Visit Summary for other counseling recommendations.  Return in about 3 weeks (around 04/16/2020) for routine PNC.  Future Appointments  Date Time Provider Department Center  04/22/2020  8:00 AM [facility]    Alberteen Spindle, CNM

## 2020-04-12 ENCOUNTER — Other Ambulatory Visit: Payer: Self-pay | Admitting: Family Medicine

## 2020-04-12 DIAGNOSIS — Z3482 Encounter for supervision of other normal pregnancy, second trimester: Secondary | ICD-10-CM

## 2020-04-16 ENCOUNTER — Ambulatory Visit: Payer: Self-pay | Admitting: Advanced Practice Midwife

## 2020-04-16 ENCOUNTER — Other Ambulatory Visit: Payer: Self-pay

## 2020-04-16 VITALS — BP 100/66 | HR 72 | Temp 97.0°F | Wt 132.0 lb

## 2020-04-16 DIAGNOSIS — Z8659 Personal history of other mental and behavioral disorders: Secondary | ICD-10-CM

## 2020-04-16 DIAGNOSIS — Z3480 Encounter for supervision of other normal pregnancy, unspecified trimester: Secondary | ICD-10-CM

## 2020-04-16 DIAGNOSIS — Z8709 Personal history of other diseases of the respiratory system: Secondary | ICD-10-CM

## 2020-04-16 DIAGNOSIS — Z3402 Encounter for supervision of normal first pregnancy, second trimester: Secondary | ICD-10-CM

## 2020-04-16 NOTE — Progress Notes (Signed)
   PRENATAL VISIT NOTE  Subjective:  Amber Potter is a 20 y.o. G2P1001 at [redacted]w[redacted]d being seen today for ongoing prenatal care.  She is currently monitored for the following issues for this low-risk pregnancy and has Major depressive disorder, single episode, moderate (HCC); Asthma; Illiteracy; Supervision of other normal pregnancy, antepartum; History of asthma; and History of depression on their problem list.  Patient reports no complaints.  Contractions: Not present. Vag. Bleeding: None.  Movement: Absent. Denies leaking of fluid/ROM.   The following portions of the patient's history were reviewed and updated as appropriate: allergies, current medications, past family history, past medical history, past social history, past surgical history and problem list. Problem list updated.  Objective:   Vitals:   04/16/20 0932  BP: 100/66  Pulse: 72  Temp: (!) 97 F (36.1 C)  Weight: 132 lb (59.9 kg)    Fetal Status: Fetal Heart Rate (bpm): 150 Fundal Height: 16 cm Movement: Absent     General:  Alert, oriented and cooperative. Patient is in no acute distress.  Skin: Skin is warm and dry. No rash noted.   Cardiovascular: Normal heart rate noted  Respiratory: Normal respiratory effort, no problems with respiration noted  Abdomen: Soft, gravid, appropriate for gestational age.  Pain/Pressure: Absent     Pelvic: Cervical exam deferred        Extremities: Normal range of motion.  Edema: None  Mental Status: Normal mood and affect. Normal behavior. Normal judgment and thought content.   Assessment and Plan:  Pregnancy: G2P1001 at [redacted]w[redacted]d  1. Encounter for supervision of normal first pregnancy in second trimester Pt "forgot" to go for FIRST screen bloodwork but did go to 02/19/20 [facility] apt Wants Quad screen today Not working; taking an English class at her church 2x/wk Here with FOB who she has been in a 3 year relationship with and is the father of both her children - QUAD Screen [facility]  Only  2. Supervision of other normal pregnancy, antepartum Pt reminded of  Anatomy u/s 04/22/20 3. History of depression Pt and FOB laughing inappropriately when discussing food/meals/prep +cry 2x/wk (pt crying in exam room when FOB left), poor appetite, energy level wnl, +sad, sleep wnl, denies SI/HI. Long discussion with pt; she states she feels sad and this happened with her last pregnancy.  She has seen Kathreen Cosier once before last pregnancy and repeatedly decliined seeing her now. Pt denies abuse by FOB.  Living with her sister, niece, FOB, 61 yo son. They share cooking duties. Did not eat breakfast today but did have dinner last night   4. History of asthma Denies sxs   Preterm labor symptoms and general obstetric precautions including but not limited to vaginal bleeding, contractions, leaking of fluid and fetal movement were reviewed in detail with the patient. Please refer to After Visit Summary for other counseling recommendations.  Return in about 4 weeks (around 05/14/2020).  Future Appointments  Date Time Provider Department Center  04/22/2020  8:00 AM [facility]    Alberteen Spindle, CNM

## 2020-04-16 NOTE — Progress Notes (Addendum)
Aware of [facility] [place] 04/22/2020 at 0800. Quad screen drawn today. Jossie Ng, RN

## 2020-04-22 ENCOUNTER — Ambulatory Visit: Payer: Self-pay | Attending: Maternal & Fetal Medicine

## 2020-04-22 ENCOUNTER — Other Ambulatory Visit: Payer: Self-pay

## 2020-04-22 DIAGNOSIS — Z3482 Encounter for supervision of other normal pregnancy, second trimester: Secondary | ICD-10-CM

## 2020-04-22 DIAGNOSIS — Z3A18 18 weeks gestation of pregnancy: Secondary | ICD-10-CM | POA: Insufficient documentation

## 2020-05-14 ENCOUNTER — Other Ambulatory Visit: Payer: Self-pay

## 2020-05-14 ENCOUNTER — Ambulatory Visit: Payer: Self-pay | Admitting: Advanced Practice Midwife

## 2020-05-14 VITALS — BP 95/51 | HR 76 | Temp 97.0°F | Wt 134.0 lb

## 2020-05-14 DIAGNOSIS — Z8709 Personal history of other diseases of the respiratory system: Secondary | ICD-10-CM

## 2020-05-14 DIAGNOSIS — Z3482 Encounter for supervision of other normal pregnancy, second trimester: Secondary | ICD-10-CM

## 2020-05-14 DIAGNOSIS — Z3480 Encounter for supervision of other normal pregnancy, unspecified trimester: Secondary | ICD-10-CM

## 2020-05-14 DIAGNOSIS — F321 Major depressive disorder, single episode, moderate: Secondary | ICD-10-CM

## 2020-05-14 DIAGNOSIS — Z8659 Personal history of other mental and behavioral disorders: Secondary | ICD-10-CM

## 2020-05-14 MED ORDER — PRENATAL VITAMIN 27-0.8 MG PO TABS: 1.0000 | ORAL_TABLET | Freq: Every day | ORAL | 0 refills | Status: DC

## 2020-05-14 NOTE — Progress Notes (Signed)
Patient here for MH RV at 21 weeks. Patient states she occasionally uses inhaler, doesn't know the medication, states it's "white and blue".Burt Knack, RN

## 2020-05-14 NOTE — Progress Notes (Signed)
More PNV given today as patient states she only has a few left.Marland KitchenMarland KitchenBurt Knack, RN

## 2020-05-14 NOTE — Progress Notes (Signed)
   PRENATAL VISIT NOTE  Subjective:  Amber Potter is a 20 y.o. G2P1001 at [redacted]w[redacted]d being seen today for ongoing prenatal care.  She is currently monitored for the following issues for this low-risk pregnancy and has Major depressive disorder, single episode, moderate (HCC); Asthma; Illiteracy; Supervision of other normal pregnancy, antepartum; History of asthma; and History of depression on their problem list.  Patient reports no complaints.  Contractions: Not present. Vag. Bleeding: None.  Movement: Present. Denies leaking of fluid/ROM.   The following portions of the patient's history were reviewed and updated as appropriate: allergies, current medications, past family history, past medical history, past social history, past surgical history and problem list. Problem list updated.  Objective:   Vitals:   05/14/20 0847  BP: (!) 95/51  Pulse: 76  Temp: (!) 97 F (36.1 C)  Weight: 134 lb (60.8 kg)    Fetal Status: Fetal Heart Rate (bpm): 140 Fundal Height: 20 cm Movement: Present     General:  Alert, oriented and cooperative. Patient is in no acute distress.  Skin: Skin is warm and dry. No rash noted.   Cardiovascular: Normal heart rate noted  Respiratory: Normal respiratory effort, no problems with respiration noted  Abdomen: Soft, gravid, appropriate for gestational age.  Pain/Pressure: Absent     Pelvic: Cervical exam deferred        Extremities: Normal range of motion.  Edema: None  Mental Status: Normal mood and affect. Normal behavior. Normal judgment and thought content.   Assessment and Plan:  Pregnancy: G2P1001 at [redacted]w[redacted]d  1. Supervision of other normal pregnancy, antepartum Pt has cough, phlegm in throat, stuffy nose, sneezing. Encouraged covid testing Not working Here with FOB. Reviewed 04/22/20 anatomy u/s with suboptimal views due to fetal postion. F/U growth 05/25/20 Quad screen 04/16/20 neg    2. History of asthma Pt denies sxs asthma Rx was given for  Albuterol on 02/18/20  3. History of depression Denies sxs  4. Major depressive disorder, single episode, moderate (HCC) Denies sxs or need for counseling   Preterm labor symptoms and general obstetric precautions including but not limited to vaginal bleeding, contractions, leaking of fluid and fetal movement were reviewed in detail with the patient. Please refer to After Visit Summary for other counseling recommendations.  Return in about 4 weeks (around 06/11/2020) for routine PNC.  Future Appointments  Date Time Provider Department Center  05/25/2020  8:00 AM [facility]    Alberteen Spindle, CNM

## 2020-05-14 NOTE — Addendum Note (Signed)
Addended by: Burt Knack on: 05/14/2020 09:51 AM   Modules accepted: Orders

## 2020-05-14 NOTE — Progress Notes (Signed)
95/61

## 2020-05-24 ENCOUNTER — Other Ambulatory Visit: Payer: Self-pay | Admitting: Family Medicine

## 2020-05-24 DIAGNOSIS — Z3482 Encounter for supervision of other normal pregnancy, second trimester: Secondary | ICD-10-CM

## 2020-05-25 ENCOUNTER — Ambulatory Visit: Payer: Self-pay | Attending: Maternal & Fetal Medicine

## 2020-05-25 ENCOUNTER — Other Ambulatory Visit: Payer: Self-pay

## 2020-05-25 DIAGNOSIS — Z3A22 22 weeks gestation of pregnancy: Secondary | ICD-10-CM | POA: Insufficient documentation

## 2020-05-25 DIAGNOSIS — Z3482 Encounter for supervision of other normal pregnancy, second trimester: Secondary | ICD-10-CM

## 2020-05-25 DIAGNOSIS — O365920 Maternal care for other known or suspected poor fetal growth, second trimester, not applicable or unspecified: Secondary | ICD-10-CM | POA: Insufficient documentation

## 2020-05-25 DIAGNOSIS — O358XX0 Maternal care for other (suspected) fetal abnormality and damage, not applicable or unspecified: Secondary | ICD-10-CM

## 2020-05-31 ENCOUNTER — Other Ambulatory Visit: Payer: Self-pay | Admitting: General Practice

## 2020-05-31 DIAGNOSIS — O365921 Maternal care for other known or suspected poor fetal growth, second trimester, fetus 1: Secondary | ICD-10-CM

## 2020-06-08 ENCOUNTER — Ambulatory Visit: Payer: Self-pay

## 2020-06-08 ENCOUNTER — Ambulatory Visit: Payer: Self-pay | Attending: Maternal & Fetal Medicine

## 2020-06-08 ENCOUNTER — Other Ambulatory Visit: Payer: Self-pay

## 2020-06-08 DIAGNOSIS — O365921 Maternal care for other known or suspected poor fetal growth, second trimester, fetus 1: Secondary | ICD-10-CM | POA: Insufficient documentation

## 2020-06-08 DIAGNOSIS — Z3A24 24 weeks gestation of pregnancy: Secondary | ICD-10-CM | POA: Insufficient documentation

## 2020-06-08 DIAGNOSIS — Z3480 Encounter for supervision of other normal pregnancy, unspecified trimester: Secondary | ICD-10-CM

## 2020-06-08 DIAGNOSIS — Z364 Encounter for antenatal screening for fetal growth retardation: Secondary | ICD-10-CM | POA: Insufficient documentation

## 2020-06-08 NOTE — Procedures (Signed)
Amber Potter 2000-09-22 [redacted]w[redacted]d  Fetus A Non-Stress Test Interpretation for 06/08/20    Fetal Heart Rate A Mode: External Baseline Rate (A): 140 bpm Variability: Moderate Accelerations: 10 x 10 Decelerations: None Multiple birth?: No  Uterine Activity Mode: Toco  Interpretation (Fetal Testing) Nonstress Test Interpretation: Reactive Comments: Dr. Grace Bushy Read NST

## 2020-06-10 ENCOUNTER — Encounter: Payer: Self-pay | Admitting: Family Medicine

## 2020-06-10 DIAGNOSIS — O365990 Maternal care for other known or suspected poor fetal growth, unspecified trimester, not applicable or unspecified: Secondary | ICD-10-CM | POA: Insufficient documentation

## 2020-06-11 ENCOUNTER — Other Ambulatory Visit: Payer: Self-pay

## 2020-06-11 ENCOUNTER — Encounter: Payer: Self-pay | Admitting: Physician Assistant

## 2020-06-11 ENCOUNTER — Ambulatory Visit: Payer: Self-pay | Admitting: Physician Assistant

## 2020-06-11 VITALS — BP 96/62 | HR 65 | Temp 97.3°F | Wt 135.2 lb

## 2020-06-11 DIAGNOSIS — F321 Major depressive disorder, single episode, moderate: Secondary | ICD-10-CM

## 2020-06-11 DIAGNOSIS — J45909 Unspecified asthma, uncomplicated: Secondary | ICD-10-CM

## 2020-06-11 DIAGNOSIS — Z3480 Encounter for supervision of other normal pregnancy, unspecified trimester: Secondary | ICD-10-CM

## 2020-06-11 DIAGNOSIS — O365990 Maternal care for other known or suspected poor fetal growth, unspecified trimester, not applicable or unspecified: Secondary | ICD-10-CM

## 2020-06-11 DIAGNOSIS — Z3482 Encounter for supervision of other normal pregnancy, second trimester: Secondary | ICD-10-CM

## 2020-06-11 NOTE — Progress Notes (Signed)
   PRENATAL VISIT NOTE  Subjective:  Amber Potter is a 20 y.o. G2P1001 at [redacted]w[redacted]d being seen today for ongoing prenatal care. Here with husband, very supportive. Per recent US, having a girl.  She is currently monitored for the following issues for this high-risk pregnancy and has Major depressive disorder, single episode, moderate (HCC); Asthma; Illiteracy; Supervision of other normal pregnancy, antepartum; History of asthma; History of depression; and Intrauterine growth restriction affecting antepartum care of mother on their problem list.  Patient reports no complaints.  Contractions: Not present. Vag. Bleeding: None.  Movement: Present. Denies leaking of fluid/ROM.   The following portions of the patient's history were reviewed and updated as appropriate: allergies, current medications, past family history, past medical history, past social history, past surgical history and problem list. Problem list updated.  Objective:   Vitals:   06/11/20 0906  BP: 96/62  Pulse: 65  Temp: (!) 97.3 F (36.3 C)  Weight: 135 lb 3.2 oz (61.3 kg)    Fetal Status: Fetal Heart Rate (bpm): 144 Fundal Height: 21 cm Movement: Present     General:  Alert, oriented and cooperative. Patient is in no acute distress.  Skin: Skin is warm and dry. No rash noted.   Cardiovascular: Normal heart rate noted  Respiratory: Normal respiratory effort, no problems with respiration noted  Abdomen: Soft, gravid, appropriate for gestational age.  Pain/Pressure: Absent     Pelvic: Cervical exam deferred        Extremities: Normal range of motion.  Edema: None  Mental Status: Normal mood and affect. Normal behavior. Normal judgment and thought content.   Assessment and Plan:  Pregnancy: G2P1001 at [redacted]w[redacted]d  1. Supervision of high risk pregnancy, antepartum Anticipatory guidance re: O'Sullivan/other testing at RV.  2. Intrauterine growth restriction affecting antepartum care of mother, single or unspecified  fetus Enc to keep f/u testing as recommended.  3. Major depressive disorder, single episode, moderate (HCC) States mood is stable, still considering LCSW, does not want to engage with her at present.  4. Asthma, unspecified asthma severity, unspecified whether complicated, unspecified whether persistent Denies current/recent asthma sx, historically gets this with URI.   Preterm labor symptoms and general obstetric precautions including but not limited to vaginal bleeding, contractions, leaking of fluid and fetal movement were reviewed in detail with the patient. Please refer to After Visit Summary for other counseling recommendations.  Return in about 2 weeks (around 06/25/2020) for Routine prenatal care, 28 wk labs (before 10:30am or 3pm).  Future Appointments  Date Time Provider Department Center  06/22/2020 11:00 AM [facility]    Landry Dyke, PA-C

## 2020-06-17 ENCOUNTER — Other Ambulatory Visit: Payer: Self-pay | Admitting: Family Medicine

## 2020-06-17 DIAGNOSIS — Z364 Encounter for antenatal screening for fetal growth retardation: Secondary | ICD-10-CM

## 2020-06-20 IMAGING — US US OB COMP +14 WK
1 series · 13 of 28 positions shown · non-contrast
Comparison: none

CLINICAL DATA: Second trimester pregnancy for fetal anatomy survey.

EXAM:
OBSTETRICAL ULTRASOUND >14 WKS

[Series 1: us ob comp +14 wk · 0.25mm/px · 13 of 109 slices shown]
[im 5/109]
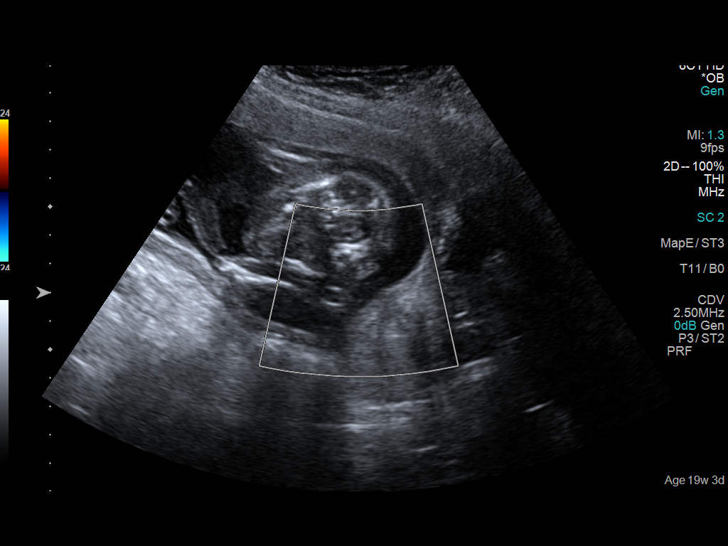
[im 13/109]
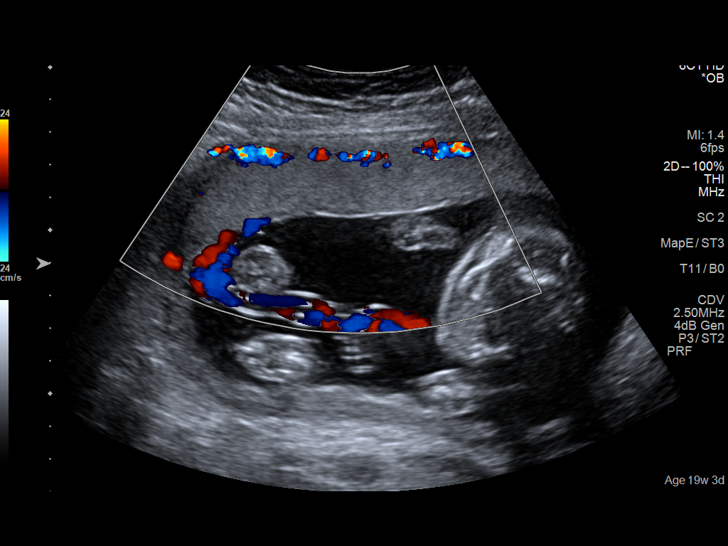
[im 21/109]
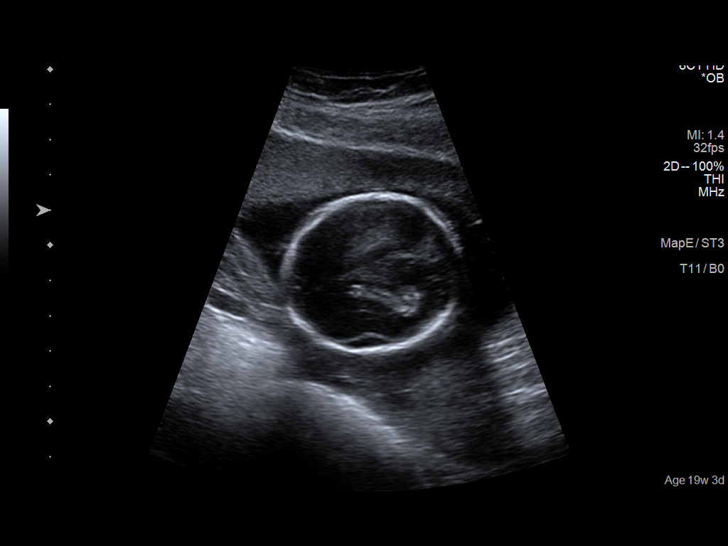
[im 29/109]
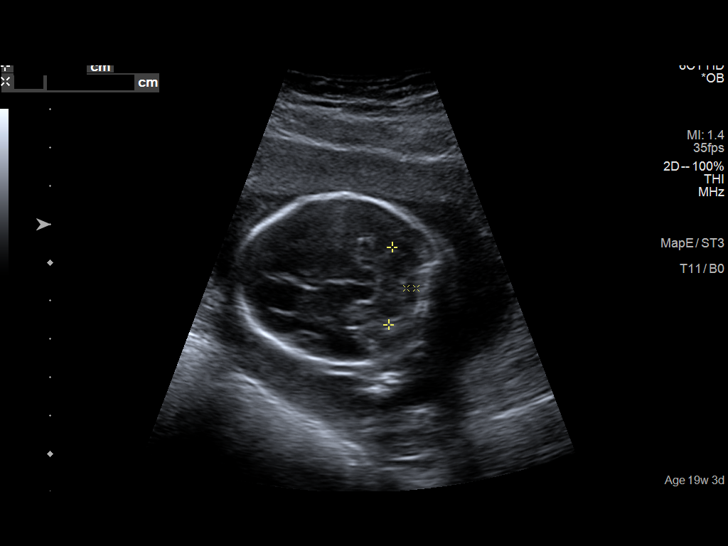
[im 37/109]
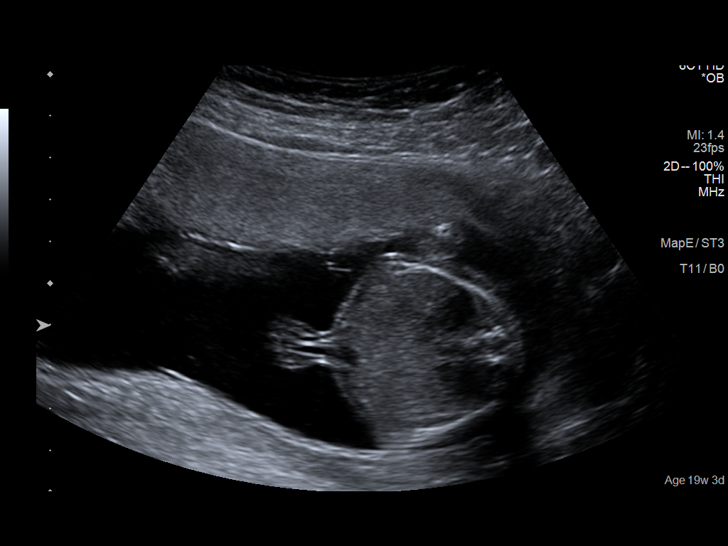
[im 45/109]
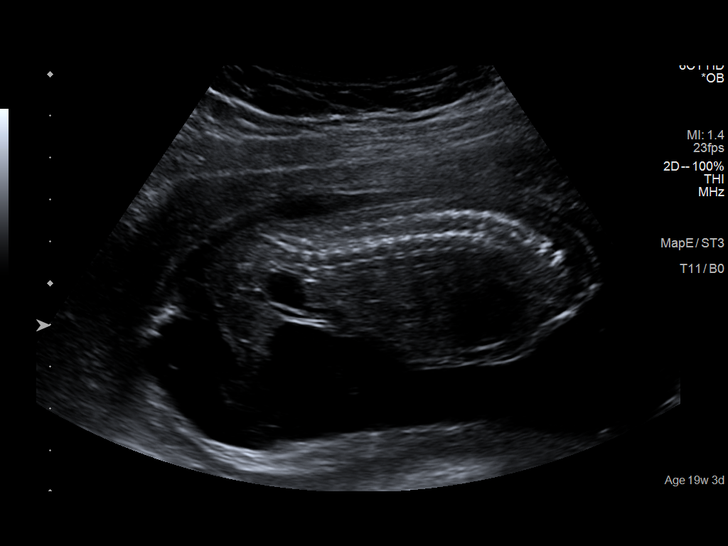
[im 57/109]
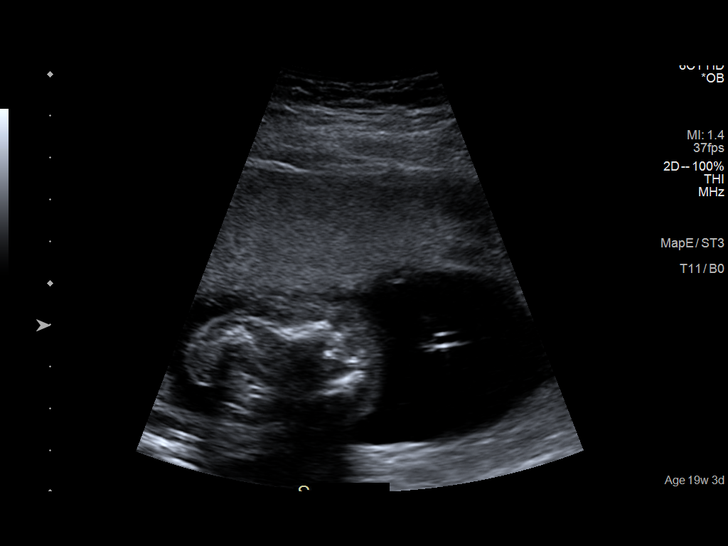
[im 65/109]
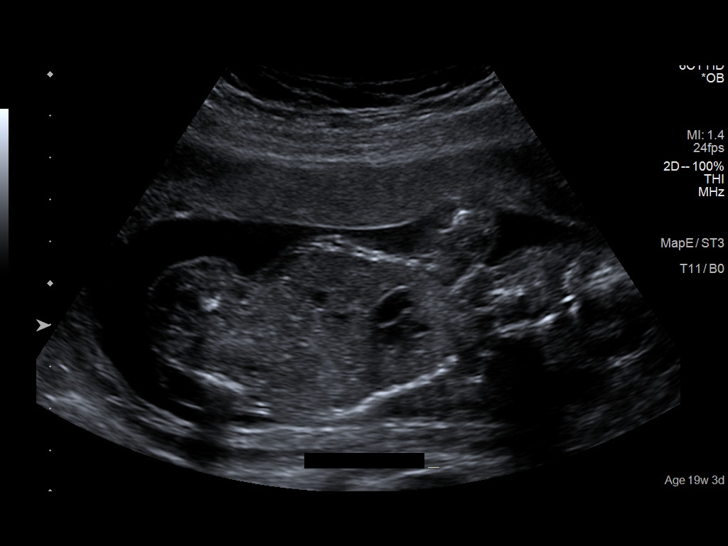
[im 73/109]
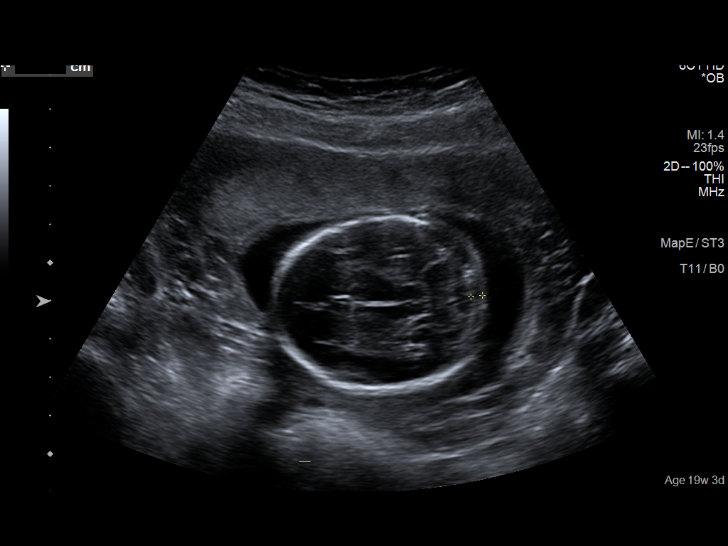
[im 81/109]
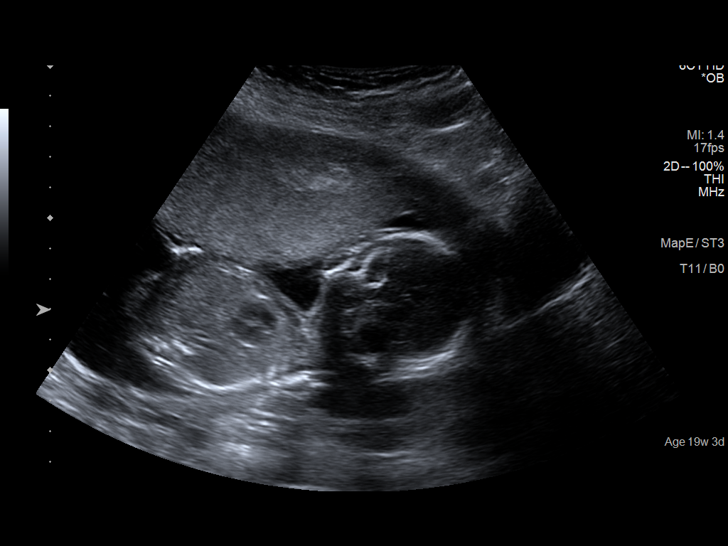
[im 89/109]
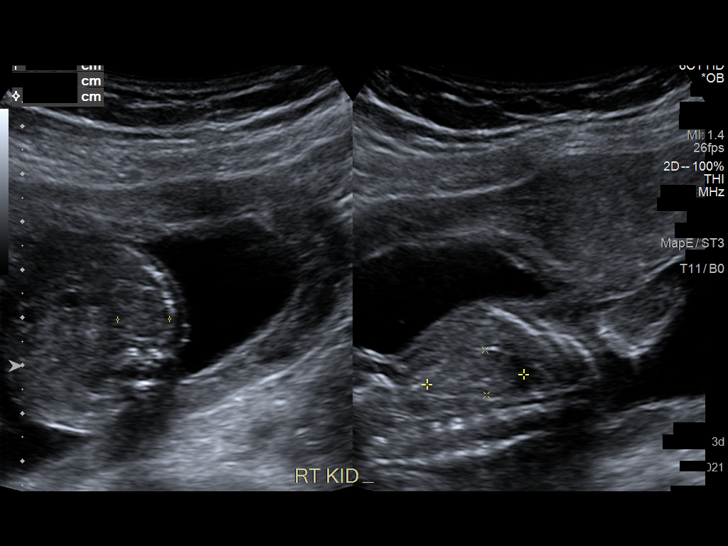
[im 97/109]
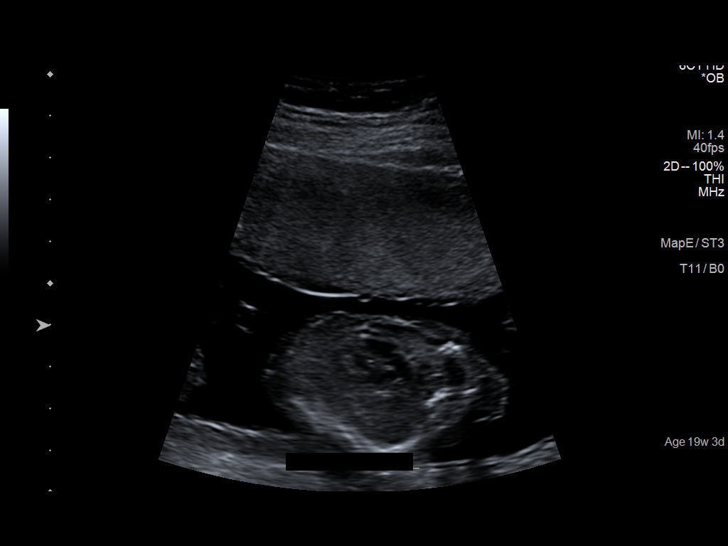
[im 105/109]
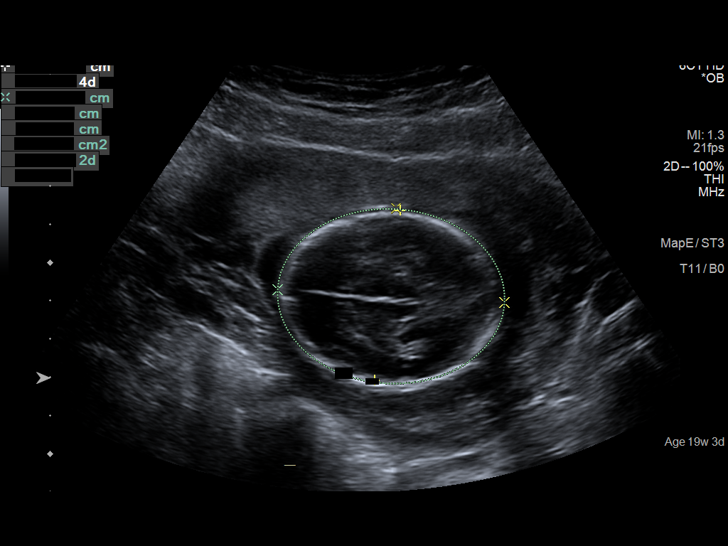

[13 of 28 positions shown; findings below may reference images not displayed]

FINDINGS: Number of Fetuses: 1

Heart Rate:  140 bpm

Movement: Yes

Presentation: Cephalic

Previa: No

Placental Location: Fundal and anterior

Amniotic Fluid (Subjective): Within normal limits

Amniotic Fluid (Objective):

Vertical pocket = 3.9cm

FETAL BIOMETRY

BPD: 4.5cm 19w 4d

HC:   16.5cm 19w 2d

AC:   14.1cm 19w 3d

FL:   3.0cm 19w 2d

Current Mean GA: 19w 4d US EDC: 04/19/2019

FETAL ANATOMY

Lateral Ventricles: Appears normal

Thalami/CSP: Appears normal

Posterior Fossa:  Appears normal

Nuchal Region: Appears normal   NFT= 3 mm

Upper Lip: Appears normal

Spine: Appears normal

4 Chamber Heart on Left: Appears normal

LVOT: Appears normal

RVOT: Appears normal

Stomach on Left: Appears normal

3 Vessel Cord: Appears normal

Cord Insertion site: Appears normal

Kidneys: Appears normal

Bladder: Appears normal

Extremities: Appears normal

Sex: Male

Technically difficult due to: Fetal position and maternal habitus

Maternal Findings:

Cervix:  3.5 cm TA
IMPRESSION: Single living IUP with estimated gestational age of 19 weeks 4 days,
and US EDC of 04/19/2019.

Unremarkable fetal anatomic survey.  No anomalies identified.

## 2020-06-22 ENCOUNTER — Ambulatory Visit: Payer: Self-pay | Attending: Obstetrics and Gynecology

## 2020-06-22 ENCOUNTER — Other Ambulatory Visit: Payer: Self-pay

## 2020-06-22 DIAGNOSIS — Z364 Encounter for antenatal screening for fetal growth retardation: Secondary | ICD-10-CM

## 2020-06-22 DIAGNOSIS — O365920 Maternal care for other known or suspected poor fetal growth, second trimester, not applicable or unspecified: Secondary | ICD-10-CM

## 2020-06-22 DIAGNOSIS — Z3A26 26 weeks gestation of pregnancy: Secondary | ICD-10-CM

## 2020-06-25 ENCOUNTER — Other Ambulatory Visit: Payer: Self-pay

## 2020-06-25 ENCOUNTER — Ambulatory Visit: Payer: Self-pay | Admitting: Family Medicine

## 2020-06-25 VITALS — BP 91/61 | HR 76 | Temp 97.1°F | Wt 135.4 lb

## 2020-06-25 DIAGNOSIS — Z3482 Encounter for supervision of other normal pregnancy, second trimester: Secondary | ICD-10-CM

## 2020-06-25 DIAGNOSIS — O365990 Maternal care for other known or suspected poor fetal growth, unspecified trimester, not applicable or unspecified: Secondary | ICD-10-CM

## 2020-06-25 DIAGNOSIS — Z3480 Encounter for supervision of other normal pregnancy, unspecified trimester: Secondary | ICD-10-CM

## 2020-06-25 DIAGNOSIS — J45909 Unspecified asthma, uncomplicated: Secondary | ICD-10-CM

## 2020-06-25 LAB — HEMOGLOBIN, FINGERSTICK: Hemoglobin: 11.6 g/dL (ref 11.1–15.9)

## 2020-06-25 NOTE — Progress Notes (Signed)
   PRENATAL VISIT NOTE  Subjective:  Amber Potter is a 20 y.o. G2P1001 at [redacted]w[redacted]d being seen today for ongoing prenatal care.  She is currently monitored for the following issues for this high-risk pregnancy and has Major depressive disorder, single episode, moderate (HCC); Asthma; Illiteracy; Supervision of other normal pregnancy, antepartum; History of asthma; History of depression; and Intrauterine growth restriction affecting antepartum care of mother on their problem list.  Patient reports no complaints.  Contractions: Not present. Vag. Bleeding: None.  Movement: Present. Denies leaking of fluid/ROM.   The following portions of the patient's history were reviewed and updated as appropriate: allergies, current medications, past family history, past medical history, past social history, past surgical history and problem list. Problem list updated.  Objective:   Vitals:   06/25/20 0827  BP: 91/61  Pulse: 76  Temp: (!) 97.1 F (36.2 C)  Weight: 135 lb 6.4 oz (61.4 kg)    Fetal Status: Fetal Heart Rate (bpm): 141 Fundal Height: 24 cm Movement: Present     General:  Alert, oriented and cooperative. Patient is in no acute distress.  Skin: Skin is warm and dry. No rash noted.   Cardiovascular: Normal heart rate noted  Respiratory: Normal respiratory effort, no problems with respiration noted  Abdomen: Soft, gravid, appropriate for gestational age.  Pain/Pressure: Absent     Pelvic: Cervical exam deferred        Extremities: Normal range of motion.  Edema: None  Mental Status: Normal mood and affect. Normal behavior. Normal judgment and thought content.   Assessment and Plan:  Pregnancy: G2P1001 at [redacted]w[redacted]d  1. Supervision of other normal pregnancy, antepartum 28 week labs today,  Anticipated guidance visits change to Q 2 weeks.  Hgb ordered 11.6.  -PHQ-9 score 0 -Reviewed prenatal visit schedule q2 wks until 36 wks, then q1 wk.    - HIV Antibody (routine testing w rflx) -  RPR - Glucose, 1 hour gestational - Tdap vaccine greater than or equal to 7yo IM - Hemoglobin, fingerstick  2. Intrauterine growth restriction affecting antepartum care of mother, single or unspecified fetus Pt had MFM appointment on 06/22/20 Impression: Cardiac Activity: Observed   Presentation:Cephalic  Placenta: Anterior EFW  810  gm  6  %(suboptimal interval weight gain) that is difficult to differentiate from a constitutionally small fetus. Recommendations- UA Doppler study in 2 weeks. Fetal growth assessment and UA Doppler in 3 weeks.  Pt aware of next appointment.    3. Asthma, unspecified asthma severity, unspecified whether complicated, unspecified whether persistent   Denies any asthma s/sx.    Preterm labor symptoms and general obstetric precautions including but not limited to vaginal bleeding, contractions, leaking of fluid and fetal movement were reviewed in detail with the patient. Please refer to After Visit Summary for other counseling recommendations.  Return in about 2 weeks (around 07/09/2020) for routine prenatal care.  Future Appointments  Date Time Provider Department Center  07/08/2020  9:30 AM [facility]  07/09/2020  8:40 AM [facility] PROVIDER [facility] None  07/20/2020  4:00 PM [facility]   Language line  Sue Lush [phone]) used for Spanish interpretation.     Wendi Snipes, FNP

## 2020-06-25 NOTE — Progress Notes (Signed)
Hgb = 11.6 WNL Reviewed with provider, Elveria Rising, FNP during clinic visit.

## 2020-06-27 LAB — RPR: RPR Ser Ql: NONREACTIVE

## 2020-06-27 LAB — HIV-1/HIV-2 QUALITATIVE RNA
HIV-1 RNA, Qualitative: NONREACTIVE
HIV-2 RNA, Qualitative: NONREACTIVE

## 2020-06-27 LAB — GLUCOSE, 1 HOUR GESTATIONAL: Gestational Diabetes Screen: 128 mg/dL (ref 65–139)

## 2020-07-05 NOTE — Addendum Note (Signed)
Addended by: Heywood Bene on: 07/05/2020 10:57 AM   Modules accepted: Orders

## 2020-07-07 ENCOUNTER — Other Ambulatory Visit: Payer: Self-pay | Admitting: Family Medicine

## 2020-07-07 DIAGNOSIS — O365930 Maternal care for other known or suspected poor fetal growth, third trimester, not applicable or unspecified: Secondary | ICD-10-CM

## 2020-07-08 ENCOUNTER — Other Ambulatory Visit: Payer: Self-pay | Admitting: Family Medicine

## 2020-07-08 ENCOUNTER — Other Ambulatory Visit: Payer: Self-pay

## 2020-07-08 ENCOUNTER — Ambulatory Visit: Payer: Self-pay | Attending: Maternal & Fetal Medicine

## 2020-07-08 DIAGNOSIS — Z3A29 29 weeks gestation of pregnancy: Secondary | ICD-10-CM | POA: Insufficient documentation

## 2020-07-08 DIAGNOSIS — Z364 Encounter for antenatal screening for fetal growth retardation: Secondary | ICD-10-CM | POA: Insufficient documentation

## 2020-07-08 DIAGNOSIS — O365930 Maternal care for other known or suspected poor fetal growth, third trimester, not applicable or unspecified: Secondary | ICD-10-CM | POA: Insufficient documentation

## 2020-07-08 DIAGNOSIS — O365931 Maternal care for other known or suspected poor fetal growth, third trimester, fetus 1: Secondary | ICD-10-CM

## 2020-07-09 ENCOUNTER — Ambulatory Visit: Payer: Self-pay | Admitting: Family Medicine

## 2020-07-09 VITALS — BP 91/57 | HR 80 | Temp 97.3°F | Wt 138.6 lb

## 2020-07-09 DIAGNOSIS — O0993 Supervision of high risk pregnancy, unspecified, third trimester: Secondary | ICD-10-CM

## 2020-07-09 DIAGNOSIS — Z8659 Personal history of other mental and behavioral disorders: Secondary | ICD-10-CM

## 2020-07-09 DIAGNOSIS — O0990 Supervision of high risk pregnancy, unspecified, unspecified trimester: Secondary | ICD-10-CM

## 2020-07-09 DIAGNOSIS — O365990 Maternal care for other known or suspected poor fetal growth, unspecified trimester, not applicable or unspecified: Secondary | ICD-10-CM

## 2020-07-09 NOTE — Progress Notes (Signed)
   PRENATAL VISIT NOTE  Subjective:  Amber Potter is a 20 y.o. G2P1001 at [redacted]w[redacted]d being seen today for ongoing prenatal care.  She is currently monitored for the following issues for this high-risk pregnancy and has Major depressive disorder, single episode, moderate (HCC); Asthma; Illiteracy; Supervision of other normal pregnancy, antepartum; History of asthma; History of depression; and Intrauterine growth restriction affecting antepartum care of mother on their problem list.  Patient reports no complaints.  Contractions: Irritability. Vag. Bleeding: None.  Movement: Present. Denies leaking of fluid/ROM.   The following portions of the patient's history were reviewed and updated as appropriate: allergies, current medications, past family history, past medical history, past social history, past surgical history and problem list. Problem list updated.  Objective:   Vitals:   07/09/20 0836  BP: (!) 91/57  Pulse: 80  Temp: (!) 97.3 F (36.3 C)  Weight: 138 lb 9.6 oz (62.9 kg)    Fetal Status: Fetal Heart Rate (bpm): 135 Fundal Height: 26 cm Movement: Present     General:  Alert, oriented and cooperative. Patient is in no acute distress.  Skin: Skin is warm and dry. No rash noted.   Cardiovascular: Normal heart rate noted  Respiratory: Normal respiratory effort, no problems with respiration noted  Abdomen: Soft, gravid, appropriate for gestational age.  Pain/Pressure: Absent     Pelvic: Cervical exam deferred        Extremities: Normal range of motion.  Edema: None  Mental Status: Normal mood and affect. Normal behavior. Normal judgment and thought content.   Assessment and Plan:  Pregnancy: G2P1001 at [redacted]w[redacted]d  1. Supervision of high risk pregnancy, antepartum Reviewed 28 week labs    2. History of depression Flat affect today- denies  feeling depressed   3. Intrauterine growth restriction affecting antepartum care of mother, single or unspecified fetus  MFM US done on  6/23 - EFW 6th% with an AC < 5th%. -UA Dopplers are normal with no evidence of AEDF or REDF  -good fetal movement and amniotic fluid volume. - return in 1 week for UA doppler   Patient aware of appointment.    Preterm labor symptoms and general obstetric precautions including but not limited to vaginal bleeding, contractions, leaking of fluid and fetal movement were reviewed in detail with the patient. Please refer to After Visit Summary for other counseling recommendations.  Return in about 2 weeks (around 07/23/2020) for routine prenatal care.  Future Appointments  Date Time Provider Department Center  07/15/2020  7:30 AM [facility]  07/15/2020  8:00 AM [facility] NST [facility] None  07/20/2020  4:00 PM [facility]  07/23/2020  8:20 AM [facility] PROVIDER [facility] None   M. [place] used for [place] interpretation.     Wendi Snipes, FNP

## 2020-07-15 ENCOUNTER — Other Ambulatory Visit: Payer: Self-pay | Admitting: Family Medicine

## 2020-07-15 ENCOUNTER — Ambulatory Visit: Payer: Self-pay

## 2020-07-15 ENCOUNTER — Ambulatory Visit: Payer: Self-pay | Attending: Obstetrics

## 2020-07-15 ENCOUNTER — Other Ambulatory Visit: Payer: Self-pay

## 2020-07-15 DIAGNOSIS — O365990 Maternal care for other known or suspected poor fetal growth, unspecified trimester, not applicable or unspecified: Secondary | ICD-10-CM

## 2020-07-15 DIAGNOSIS — Z3A30 30 weeks gestation of pregnancy: Secondary | ICD-10-CM | POA: Insufficient documentation

## 2020-07-15 DIAGNOSIS — O365931 Maternal care for other known or suspected poor fetal growth, third trimester, fetus 1: Secondary | ICD-10-CM

## 2020-07-15 DIAGNOSIS — O365930 Maternal care for other known or suspected poor fetal growth, third trimester, not applicable or unspecified: Secondary | ICD-10-CM | POA: Insufficient documentation

## 2020-07-20 ENCOUNTER — Ambulatory Visit: Payer: Self-pay | Attending: Obstetrics and Gynecology

## 2020-07-20 ENCOUNTER — Other Ambulatory Visit: Payer: Self-pay

## 2020-07-20 VITALS — BP 104/60 | HR 87 | Temp 98.1°F | Resp 18 | Ht 60.0 in | Wt 139.5 lb

## 2020-07-20 DIAGNOSIS — O365130 Maternal care for known or suspected placental insufficiency, third trimester, not applicable or unspecified: Secondary | ICD-10-CM

## 2020-07-20 DIAGNOSIS — O365930 Maternal care for other known or suspected poor fetal growth, third trimester, not applicable or unspecified: Secondary | ICD-10-CM | POA: Insufficient documentation

## 2020-07-20 DIAGNOSIS — Z3A30 30 weeks gestation of pregnancy: Secondary | ICD-10-CM | POA: Insufficient documentation

## 2020-07-20 DIAGNOSIS — O365990 Maternal care for other known or suspected poor fetal growth, unspecified trimester, not applicable or unspecified: Secondary | ICD-10-CM

## 2020-07-23 ENCOUNTER — Ambulatory Visit: Payer: Self-pay | Admitting: Family Medicine

## 2020-07-23 ENCOUNTER — Other Ambulatory Visit: Payer: Self-pay

## 2020-07-23 VITALS — BP 86/61 | HR 75 | Temp 98.0°F | Wt 138.2 lb

## 2020-07-23 DIAGNOSIS — Z8659 Personal history of other mental and behavioral disorders: Secondary | ICD-10-CM

## 2020-07-23 DIAGNOSIS — O0990 Supervision of high risk pregnancy, unspecified, unspecified trimester: Secondary | ICD-10-CM

## 2020-07-23 DIAGNOSIS — O0993 Supervision of high risk pregnancy, unspecified, third trimester: Secondary | ICD-10-CM

## 2020-07-23 DIAGNOSIS — Z8709 Personal history of other diseases of the respiratory system: Secondary | ICD-10-CM

## 2020-07-23 DIAGNOSIS — O365990 Maternal care for other known or suspected poor fetal growth, unspecified trimester, not applicable or unspecified: Secondary | ICD-10-CM

## 2020-07-23 NOTE — Progress Notes (Signed)
Pt denies visits to ER since last visit at [facility]. Taking PNV. Counseled about kick counts and tracker provided.

## 2020-07-23 NOTE — Progress Notes (Signed)
   PRENATAL VISIT NOTE  Subjective:  Amber Potter is a 20 y.o. G2P1001 at [redacted]w[redacted]d being seen today for ongoing prenatal care.  She is currently monitored for the following issues for this high-risk pregnancy and has Major depressive disorder, single episode, moderate (HCC); Asthma; Illiteracy; Supervision of other normal pregnancy, antepartum; History of asthma; History of depression; and Intrauterine growth restriction affecting antepartum care of mother on their problem list.  Patient reports no complaints.  Contractions: Irritability. Vag. Bleeding: None.  Movement: Present. Denies leaking of fluid/ROM.   The following portions of the patient's history were reviewed and updated as appropriate: allergies, current medications, past family history, past medical history, past social history, past surgical history and problem list. Problem list updated.  Objective:   Vitals:   07/23/20 0819  BP: (!) 86/61  Pulse: 75  Temp: 98 F (36.7 C)  Weight: 138 lb 3.2 oz (62.7 kg)    Fetal Status: Fetal Heart Rate (bpm): 131 Fundal Height: 28 cm Movement: Present     General:  Alert, oriented and cooperative. Patient is in no acute distress.  Skin: Skin is warm and dry. No rash noted.   Cardiovascular: Normal heart rate noted  Respiratory: Normal respiratory effort, no problems with respiration noted  Abdomen: Soft, gravid, appropriate for gestational age.        Pelvic: Cervical exam deferred        Extremities: Normal range of motion.  Edema: None  Mental Status: Normal mood and affect. Normal behavior. Normal judgment and thought content.   Assessment and Plan:  Pregnancy: G2P1001 at [redacted]w[redacted]d  1. Supervision of high risk pregnancy, antepartum -FOB at visit today-seems supportive - patient walking 2 x per weeks as exercise ~1 hr.   -As part of routine education we reviewed s/sx of pre-eclampsia and to seek medical care ASAP if present.  -PP contraception plan is nexplanon -Feeding  plan is breast  -RN Reviewed kick counts.   2. History of depression Denies depression and/or need for therapist.   Flat affect today.   3. Intrauterine growth restriction affecting antepartum care of mother, single or unspecified fetus Appt with MFM was on 07/20/20-  amniotic fluid is normal and good fetal activity seen. Antenatal testing is reassuring. BPP 8/8. Follow up appointment in 3 weeks.    4. History of asthma Denies s/sx of asthma   Preterm labor symptoms and general obstetric precautions including but not limited to vaginal bleeding, contractions, leaking of fluid and fetal movement were reviewed in detail with the patient. Please refer to After Visit Summary for other counseling recommendations.  Return in about 2 weeks (around 08/06/2020) for routine prenatal care.  Future Appointments  Date Time Provider Department Center  08/05/2020  9:15 AM [facility]  08/06/2020  8:40 AM [facility] PROVIDER [facility] None   Language line ([phone])  used for Spanish interpretation.     Wendi Snipes, FNP

## 2020-08-04 ENCOUNTER — Other Ambulatory Visit: Payer: Self-pay | Admitting: Family Medicine

## 2020-08-04 DIAGNOSIS — O365931 Maternal care for other known or suspected poor fetal growth, third trimester, fetus 1: Secondary | ICD-10-CM

## 2020-08-05 ENCOUNTER — Other Ambulatory Visit: Payer: Self-pay

## 2020-08-05 ENCOUNTER — Ambulatory Visit: Payer: Self-pay | Attending: Maternal & Fetal Medicine

## 2020-08-05 DIAGNOSIS — Z3A33 33 weeks gestation of pregnancy: Secondary | ICD-10-CM | POA: Insufficient documentation

## 2020-08-05 DIAGNOSIS — O365931 Maternal care for other known or suspected poor fetal growth, third trimester, fetus 1: Secondary | ICD-10-CM

## 2020-08-05 DIAGNOSIS — O365930 Maternal care for other known or suspected poor fetal growth, third trimester, not applicable or unspecified: Secondary | ICD-10-CM | POA: Insufficient documentation

## 2020-08-06 ENCOUNTER — Ambulatory Visit: Payer: Self-pay | Admitting: Family Medicine

## 2020-08-06 VITALS — BP 85/56 | HR 69 | Temp 98.0°F | Wt 140.8 lb

## 2020-08-06 DIAGNOSIS — O0993 Supervision of high risk pregnancy, unspecified, third trimester: Secondary | ICD-10-CM

## 2020-08-06 DIAGNOSIS — O0990 Supervision of high risk pregnancy, unspecified, unspecified trimester: Secondary | ICD-10-CM

## 2020-08-06 DIAGNOSIS — O365990 Maternal care for other known or suspected poor fetal growth, unspecified trimester, not applicable or unspecified: Secondary | ICD-10-CM

## 2020-08-06 DIAGNOSIS — Z8659 Personal history of other mental and behavioral disorders: Secondary | ICD-10-CM

## 2020-08-06 NOTE — Progress Notes (Signed)
   PRENATAL VISIT NOTE  Subjective:  Amber Potter is a 20 y.o. G2P1001 at [redacted]w[redacted]d being seen today for ongoing prenatal care.  She is currently monitored for the following issues for this high-risk pregnancy and has Major depressive disorder, single episode, moderate (HCC); Asthma; Illiteracy; Supervision of other normal pregnancy, antepartum; History of asthma; History of depression; and Intrauterine growth restriction affecting antepartum care of mother on their problem list.  Patient reports no complaints.  Contractions: Irritability. Vag. Bleeding: None.  Movement: Present. Denies leaking of fluid/ROM.   The following portions of the patient's history were reviewed and updated as appropriate: allergies, current medications, past family history, past medical history, past social history, past surgical history and problem list. Problem list updated.  Objective:   Vitals:   08/06/20 0916  BP: (!) 85/56  Pulse: 69  Temp: 98 F (36.7 C)  Weight: 140 lb 12.8 oz (63.9 kg)    Fetal Status: Fetal Heart Rate (bpm): 137 Fundal Height: 30 cm Movement: Present     General:  Alert, oriented and cooperative. Patient is in no acute distress.  Skin: Skin is warm and dry. No rash noted.   Cardiovascular: Normal heart rate noted  Respiratory: Normal respiratory effort, no problems with respiration noted  Abdomen: Soft, gravid, appropriate for gestational age.        Pelvic: Cervical exam deferred        Extremities: Normal range of motion.  Edema: None  Mental Status: Normal mood and affect. Normal behavior. Normal judgment and thought content.   Assessment and Plan:  Pregnancy: G2P1001 at [redacted]w[redacted]d   1. Supervision of high risk pregnancy, antepartum -Reviewed labs from 28 wk visit  -As part of routine education we reviewed s/sx of pre-eclampsia and to seek medical care ASAP if present.  -PP contraception plan is nexplanon  -Feeding plan is breast  -RN Reviewed kick counts.    2.  Intrauterine growth restriction affecting antepartum care of mother, single or unspecified fetus Reviewed 08/05/20 U/S  Repeat in 4 weeks  3. History of depression Flat affect  today. Pt denies any s/sx depression or self harm .   Preterm labor symptoms and general obstetric precautions including but not limited to vaginal bleeding, contractions, leaking of fluid and fetal movement were reviewed in detail with the patient. Please refer to After Visit Summary for other counseling recommendations.  Return in about 2 weeks (around 08/20/2020) for routine prenatal care.  Future Appointments  Date Time Provider Department Center  08/20/2020  8:20 AM [facility] PROVIDER [facility] None  09/02/2020  9:30 AM [facility]   V. Olmedo  used for [place] interpretation.     Wendi Snipes, FNP

## 2020-08-06 NOTE — Progress Notes (Signed)
Patient here for MH RV at 33w 1d.   Appointment reminder card given for upcoming [place] MFM on 09/02/20 at 09:30.   Floy Sabina, RN

## 2020-08-20 ENCOUNTER — Other Ambulatory Visit: Payer: Self-pay

## 2020-08-20 ENCOUNTER — Ambulatory Visit: Payer: Self-pay | Admitting: Family Medicine

## 2020-08-20 VITALS — BP 97/63 | HR 71 | Temp 97.2°F | Wt 142.2 lb

## 2020-08-20 DIAGNOSIS — O365990 Maternal care for other known or suspected poor fetal growth, unspecified trimester, not applicable or unspecified: Secondary | ICD-10-CM

## 2020-08-20 DIAGNOSIS — O0990 Supervision of high risk pregnancy, unspecified, unspecified trimester: Secondary | ICD-10-CM

## 2020-08-20 DIAGNOSIS — Z8659 Personal history of other mental and behavioral disorders: Secondary | ICD-10-CM

## 2020-08-20 DIAGNOSIS — O0993 Supervision of high risk pregnancy, unspecified, third trimester: Secondary | ICD-10-CM

## 2020-08-20 DIAGNOSIS — Z8709 Personal history of other diseases of the respiratory system: Secondary | ICD-10-CM

## 2020-08-20 NOTE — Progress Notes (Signed)
   PRENATAL VISIT NOTE  Subjective:  Amber Potter is a 20 y.o. G2P1001 at [redacted]w[redacted]d being seen today for ongoing prenatal care.  She is currently monitored for the following issues for this high-risk pregnancy and has Major depressive disorder, single episode, moderate (HCC); Asthma; Illiteracy; Supervision of other normal pregnancy, antepartum; History of asthma; History of depression; and Intrauterine growth restriction affecting antepartum care of mother on their problem list.  Patient reports lower abdominal pressure.  Contractions: Irritability. Vag. Bleeding: None.  Movement: Present. Denies leaking of fluid/ROM.   The following portions of the patient's history were reviewed and updated as appropriate: allergies, current medications, past family history, past medical history, past social history, past surgical history and problem list. Problem list updated.  Objective:   Vitals:   08/20/20 0839  BP: 97/63  Pulse: 71  Temp: (!) 97.2 F (36.2 C)  Weight: 142 lb 3.2 oz (64.5 kg)    Fetal Status: Fetal Heart Rate (bpm): 135 Fundal Height: 32 cm Movement: Present     General:  Alert, oriented and cooperative. Patient is in no acute distress.  Skin: Skin is warm and dry. No rash noted.   Cardiovascular: Normal heart rate noted  Respiratory: Normal respiratory effort, no problems with respiration noted  Abdomen: Soft, gravid, appropriate for gestational age.        Pelvic: Cervical exam deferred        Extremities: Normal range of motion.  Edema: None  Mental Status: Normal mood and affect. Normal behavior. Normal judgment and thought content.   Assessment and Plan:  Pregnancy: G2P1001 at [redacted]w[redacted]d  1. Supervision of high risk pregnancy, antepartum -Discussed normal prenatal changes and pain/pressure associated with preparing for delivery.  -taking PNV as directed.   -discussed 36 week labs at next appointment  -discussed weekly visits until delivery  -discussed s/sx of  preeclampsia and when to call clinic vs go to hospital. Pt and partner verbalized understanding.  - partner present for visit   2. Intrauterine growth restriction affecting antepartum care of mother, single or unspecified fetus -aware of follow up appt on 09/02/20 @ 9:30 am    3. History of depression Flat affect, occasional smile with conversation, denies depression.    4. History of asthma -denies s/sx    Preterm labor symptoms and general obstetric precautions including but not limited to vaginal bleeding, contractions, leaking of fluid and fetal movement were reviewed in detail with the patient. Please refer to After Visit Summary for other counseling recommendations.  Return in about 1 week (around 08/27/2020) for 36 week labs.  Future Appointments  Date Time Provider Department Center  08/27/2020  9:00 AM [facility] PROVIDER [facility] None  09/02/2020  9:30 AM [facility]   M. [place] used for [place] interpretation.     Wendi Snipes, FNP

## 2020-08-20 NOTE — Progress Notes (Signed)
Patient here for MH RV at 35 1/7. Here with partner, aware of 36 labs next week.Burt Knack, RN

## 2020-08-27 ENCOUNTER — Ambulatory Visit: Payer: Self-pay

## 2020-08-27 ENCOUNTER — Ambulatory Visit: Payer: Self-pay | Admitting: Advanced Practice Midwife

## 2020-08-27 ENCOUNTER — Other Ambulatory Visit: Payer: Self-pay

## 2020-08-27 VITALS — BP 95/62 | HR 65 | Temp 97.7°F | Wt 143.2 lb

## 2020-08-27 DIAGNOSIS — Z8709 Personal history of other diseases of the respiratory system: Secondary | ICD-10-CM

## 2020-08-27 DIAGNOSIS — O0993 Supervision of high risk pregnancy, unspecified, third trimester: Secondary | ICD-10-CM

## 2020-08-27 DIAGNOSIS — Z3480 Encounter for supervision of other normal pregnancy, unspecified trimester: Secondary | ICD-10-CM

## 2020-08-27 DIAGNOSIS — O365990 Maternal care for other known or suspected poor fetal growth, unspecified trimester, not applicable or unspecified: Secondary | ICD-10-CM

## 2020-08-27 DIAGNOSIS — O0990 Supervision of high risk pregnancy, unspecified, unspecified trimester: Secondary | ICD-10-CM

## 2020-08-27 MED ORDER — PRENATAL VITAMIN 27-0.8 MG PO TABS: 1.0000 | ORAL_TABLET | Freq: Every day | ORAL | 0 refills | Status: AC

## 2020-08-27 NOTE — Progress Notes (Addendum)
   PRENATAL VISIT NOTE  Subjective:  Amber Potter is a 20 y.o. G2P1001 at [redacted]w[redacted]d being seen today for ongoing prenatal care.  She is currently monitored for the following issues for this high-risk pregnancy and has Major depressive disorder, single episode, moderate (HCC); Asthma; Illiteracy; Supervision of other normal pregnancy, antepartum; History of asthma; History of depression; and Intrauterine growth restriction affecting antepartum care of mother on their problem list.  Patient reports no complaints.  Contractions: Not present. Vag. Bleeding: None.  Movement: Present. Denies leaking of fluid/ROM.   The following portions of the patient's history were reviewed and updated as appropriate: allergies, current medications, past family history, past medical history, past social history, past surgical history and problem list. Problem list updated.  Objective:   Vitals:   08/27/20 0909  BP: 95/62  Pulse: 65  Temp: 97.7 F (36.5 C)  Weight: 143 lb 3.2 oz (65 kg)    Fetal Status: Fetal Heart Rate (bpm): 140 Fundal Height: 34 cm Movement: Present  Presentation: Vertex  General:  Alert, oriented and cooperative. Patient is in no acute distress.  Skin: Skin is warm and dry. No rash noted.   Cardiovascular: Normal heart rate noted  Respiratory: Normal respiratory effort, no problems with respiration noted  Abdomen: Soft, gravid, appropriate for gestational age.  Pain/Pressure: Absent     Pelvic: Cervical exam deferred        Extremities: Normal range of motion.  Edema: None  Mental Status: Normal mood and affect. Normal behavior. Normal judgment and thought content.   Assessment and Plan:  Pregnancy: G2P1001 at [redacted]w[redacted]d  1. Supervision of high risk pregnancy, antepartum Knows when to go to L&D.  Has car seat and ready for baby at home C/o GERD--suggestions given Pt self collected 36 wk labs--pt did not collect GBS culture properly--provider recollected for pt - Chlamydia/GC  NAA, Confirmation - Culture, beta strep (group b only) - Prenatal Vit-Fe Fumarate-FA (PRENATAL VITAMIN) 27-0.8 MG TABS; Take 1 tablet by mouth daily.  Dispense: 100 tablet; Refill: 0  2. History of asthma Denies sxs    4. Intrauterine growth restriction affecting antepartum care of mother, single or unspecified fetus 23 lb 3.2 oz (10.5 kg) Reviewed last u/s 08/05/20 with normal growth interval, AFI wnl,    Preterm labor symptoms and general obstetric precautions including but not limited to vaginal bleeding, contractions, leaking of fluid and fetal movement were reviewed in detail with the patient. Please refer to After Visit Summary for other counseling recommendations.  Return in about 1 week (around 09/03/2020) for routine PNC.  Future Appointments  Date Time Provider Department Center  09/02/2020  9:30 AM [facility]    Alberteen Spindle, CNM

## 2020-08-27 NOTE — Progress Notes (Signed)
Patient here for MH RV at 36 1/7. 36 week labs today (patient self-collecting) and packet given and reviewed with patient. Needs more PNV. Aware of 09/02/2020 U/S.Marland KitchenMarland KitchenBurt Knack, RN

## 2020-08-30 LAB — CULTURE, BETA STREP (GROUP B ONLY): Strep Gp B Culture: POSITIVE — AB

## 2020-08-31 ENCOUNTER — Other Ambulatory Visit: Payer: Self-pay | Admitting: Maternal & Fetal Medicine

## 2020-08-31 DIAGNOSIS — B951 Streptococcus, group B, as the cause of diseases classified elsewhere: Secondary | ICD-10-CM | POA: Insufficient documentation

## 2020-08-31 DIAGNOSIS — O26843 Uterine size-date discrepancy, third trimester: Secondary | ICD-10-CM

## 2020-08-31 LAB — CHLAMYDIA/GC NAA, CONFIRMATION
Chlamydia trachomatis, NAA: NEGATIVE
Neisseria gonorrhoeae, NAA: NEGATIVE

## 2020-09-02 ENCOUNTER — Ambulatory Visit: Payer: Self-pay | Attending: Maternal & Fetal Medicine

## 2020-09-02 ENCOUNTER — Other Ambulatory Visit: Payer: Self-pay

## 2020-09-02 DIAGNOSIS — O26843 Uterine size-date discrepancy, third trimester: Secondary | ICD-10-CM

## 2020-09-02 DIAGNOSIS — Z3A37 37 weeks gestation of pregnancy: Secondary | ICD-10-CM

## 2020-09-02 DIAGNOSIS — O365930 Maternal care for other known or suspected poor fetal growth, third trimester, not applicable or unspecified: Secondary | ICD-10-CM

## 2020-09-03 ENCOUNTER — Other Ambulatory Visit: Payer: Self-pay

## 2020-09-03 ENCOUNTER — Ambulatory Visit: Payer: Self-pay | Admitting: Family Medicine

## 2020-09-03 VITALS — BP 88/54 | HR 77 | Temp 97.6°F | Wt 144.6 lb

## 2020-09-03 DIAGNOSIS — O365990 Maternal care for other known or suspected poor fetal growth, unspecified trimester, not applicable or unspecified: Secondary | ICD-10-CM

## 2020-09-03 DIAGNOSIS — Z8659 Personal history of other mental and behavioral disorders: Secondary | ICD-10-CM

## 2020-09-03 DIAGNOSIS — O0990 Supervision of high risk pregnancy, unspecified, unspecified trimester: Secondary | ICD-10-CM

## 2020-09-03 DIAGNOSIS — O0993 Supervision of high risk pregnancy, unspecified, third trimester: Secondary | ICD-10-CM

## 2020-09-03 NOTE — Progress Notes (Signed)
Kept 09/02/2020 [facility] OB US appt. Jossie Ng, RN

## 2020-09-04 NOTE — Progress Notes (Signed)
   PRENATAL VISIT NOTE  Subjective:  Amber Potter is a 20 y.o. G2P1001 at [redacted]w[redacted]d being seen today for ongoing prenatal care.  She is currently monitored for the following issues for this high-risk pregnancy and has Major depressive disorder, single episode, moderate (HCC); Asthma; Illiteracy; Supervision of other normal pregnancy, antepartum; History of asthma; History of depression; Intrauterine growth restriction affecting antepartum care of mother; and Positive GBS test on their problem list.  Patient reports no complaints.  Contractions: Irritability. Vag. Bleeding: None.  Movement: Present.  Denies leaking of fluid/ROM.   The following portions of the patient's history were reviewed and updated as appropriate: allergies, current medications, past family history, past medical history, past social history, past surgical history and problem list. Problem list updated.  Objective:   Vitals:   09/03/20 0915  BP: (!) 88/54  Pulse: 77  Temp: 97.6 F (36.4 C)  Weight: 144 lb 9.6 oz (65.6 kg)    Fetal Status: Fetal Heart Rate (bpm): 132 Fundal Height: 35 cm Movement: Present  Presentation: Vertex  General:  Alert, oriented and cooperative. Patient is in no acute distress.  Skin: Skin is warm and dry. No rash noted.   Cardiovascular: Normal heart rate noted  Respiratory: Normal respiratory effort, no problems with respiration noted  Abdomen: Soft, gravid, appropriate for gestational age.  Pain/Pressure: Absent     Pelvic: Cervical exam deferred        Extremities: Normal range of motion.  Edema: None  Mental Status: Normal mood and affect. Normal behavior. Normal judgment and thought content.   Assessment and Plan:  Pregnancy: G2P1001 at [redacted]w[redacted]d  1. Supervision of high risk pregnancy, antepartum -discussed 36 week lab results -GBS +- meaning and treatment discussed with patient and partner . Questions answered.   -Baby ready-  patient has car seat, installed, informed that  fire department can install/ check car seat.  Has sleeping arrangements.  -walking daily for exercise.   2. Intrauterine growth restriction affecting antepartum care of mother, single or unspecified fetus -discussed result of 09/02/20 U/S,  EFW 22%  (6 lbs)  AF- WNL, anterior placenta and presentation is cephalic  -denies questions   3. History of depression Denies s/sx of depression, self harm or harm to other.  Flat affect at visit today.    Preterm labor symptoms and general obstetric precautions including but not limited to vaginal bleeding, contractions, leaking of fluid and fetal movement were reviewed in detail with the patient. Please refer to After Visit Summary for other counseling recommendations.  Return in about 1 week (around 09/10/2020) for routine prenatal care.  Future Appointments  Date Time Provider Department Center  09/10/2020  8:40 AM [facility] PROVIDER [facility] None   M. [place] used for [place] interpretation.     Wendi Snipes, FNP

## 2020-09-10 ENCOUNTER — Ambulatory Visit: Payer: Self-pay | Admitting: Family Medicine

## 2020-09-10 ENCOUNTER — Other Ambulatory Visit: Payer: Self-pay

## 2020-09-10 VITALS — BP 95/58 | HR 70 | Temp 98.1°F | Wt 145.6 lb

## 2020-09-10 DIAGNOSIS — O365990 Maternal care for other known or suspected poor fetal growth, unspecified trimester, not applicable or unspecified: Secondary | ICD-10-CM

## 2020-09-10 DIAGNOSIS — O0990 Supervision of high risk pregnancy, unspecified, unspecified trimester: Secondary | ICD-10-CM

## 2020-09-10 DIAGNOSIS — O0993 Supervision of high risk pregnancy, unspecified, third trimester: Secondary | ICD-10-CM

## 2020-09-10 DIAGNOSIS — Z8659 Personal history of other mental and behavioral disorders: Secondary | ICD-10-CM

## 2020-09-10 NOTE — Progress Notes (Signed)
Patient here for MH RV at 38 1/7. .Tyffani Foglesong Brewer-Jensen, RN  

## 2020-09-12 NOTE — Progress Notes (Signed)
   PRENATAL VISIT NOTE  Subjective:  Amber Potter is a 20 y.o. G2P1001 at [redacted]w[redacted]d being seen today for ongoing prenatal care.  She is currently monitored for the following issues for this high-risk pregnancy and has Major depressive disorder, single episode, moderate (HCC); Asthma; Illiteracy; Supervision of other normal pregnancy, antepartum; History of asthma; History of depression; Intrauterine growth restriction affecting antepartum care of mother; and Positive GBS test on their problem list.  Patient reports no complaints.  Contractions: Irritability. Vag. Bleeding: None.  Movement: Present. Denies leaking of fluid/ROM.   The following portions of the patient's history were reviewed and updated as appropriate: allergies, current medications, past family history, past medical history, past social history, past surgical history and problem list. Problem list updated.  Objective:   Vitals:   09/10/20 0854  BP: (!) 95/58  Pulse: 70  Temp: 98.1 F (36.7 C)  Weight: 145 lb 9.6 oz (66 kg)    Fetal Status: Fetal Heart Rate (bpm): 141 Fundal Height: 35 cm Movement: Present     General:  Alert, oriented and cooperative. Patient is in no acute distress.  Skin: Skin is warm and dry. No rash noted.   Cardiovascular: Normal heart rate noted  Respiratory: Normal respiratory effort, no problems with respiration noted  Abdomen: Soft, gravid, appropriate for gestational age.  Pain/Pressure: Absent     Pelvic: Cervical exam deferred        Extremities: Normal range of motion.  Edema: None  Mental Status: Normal mood and affect. Normal behavior. Normal judgment and thought content.   Assessment and Plan:  Pregnancy: G2P1001 at [redacted]w[redacted]d  1. Supervision of high risk pregnancy, antepartum Walking for exercise  Discussed IOL paperwork at next visit.    2. History of depression Denies s/sx of depression   3. Intrauterine growth restriction affecting antepartum care of mother, single or  unspecified fetus Reviewed U/S at last visit    Term labor symptoms and general obstetric precautions including but not limited to vaginal bleeding, contractions, leaking of fluid and fetal movement were reviewed in detail with the patient. Please refer to After Visit Summary for other counseling recommendations.  Return in about 1 week (around 09/17/2020) for routine prenatal care.  Future Appointments  Date Time Provider Department Center  09/17/2020 10:20 AM [facility] PROVIDER [facility] None   M. [place]  used for [place] interpretation.     Wendi Snipes, FNP

## 2020-09-17 ENCOUNTER — Other Ambulatory Visit: Payer: Self-pay

## 2020-09-17 ENCOUNTER — Ambulatory Visit: Payer: Self-pay | Admitting: Advanced Practice Midwife

## 2020-09-17 VITALS — BP 107/70 | HR 80 | Temp 97.5°F | Wt 146.0 lb

## 2020-09-17 DIAGNOSIS — O365990 Maternal care for other known or suspected poor fetal growth, unspecified trimester, not applicable or unspecified: Secondary | ICD-10-CM

## 2020-09-17 DIAGNOSIS — F321 Major depressive disorder, single episode, moderate: Secondary | ICD-10-CM

## 2020-09-17 DIAGNOSIS — Z3483 Encounter for supervision of other normal pregnancy, third trimester: Secondary | ICD-10-CM

## 2020-09-17 DIAGNOSIS — Z3480 Encounter for supervision of other normal pregnancy, unspecified trimester: Secondary | ICD-10-CM

## 2020-09-17 NOTE — Progress Notes (Signed)
IOL referral faxed with confirmation.Burt Knack, RN

## 2020-09-17 NOTE — Progress Notes (Signed)
   PRENATAL VISIT NOTE  Subjective:  Amber Potter is a 20 y.o. G2P1001 at [redacted]w[redacted]d being seen today for ongoing prenatal care.  She is currently monitored for the following issues for this low-risk pregnancy and has Major depressive disorder, single episode, moderate (HCC); Asthma; Illiteracy; Supervision of other normal pregnancy, antepartum; History of asthma; History of depression; Intrauterine growth restriction affecting antepartum care of mother; and Positive GBS test on their problem list.  Patient reports backache.  Contractions: Not present. Vag. Bleeding: None.  Movement: Present. Denies leaking of fluid/ROM.   The following portions of the patient's history were reviewed and updated as appropriate: allergies, current medications, past family history, past medical history, past social history, past surgical history and problem list. Problem list updated.  Objective:   Vitals:   09/17/20 1018  BP: 107/70  Pulse: 80  Temp: (!) 97.5 F (36.4 C)  Weight: 146 lb (66.2 kg)    Fetal Status: Fetal Heart Rate (bpm): 140 Fundal Height: 34 cm Movement: Present  Presentation: Vertex  General:  Alert, oriented and cooperative. Patient is in no acute distress.  Skin: Skin is warm and dry. No rash noted.   Cardiovascular: Normal heart rate noted  Respiratory: Normal respiratory effort, no problems with respiration noted  Abdomen: Soft, gravid, appropriate for gestational age.  Pain/Pressure: Absent     Pelvic: Cervical exam performed Dilation: Closed Effacement (%): Thick Station: Ballotable  Extremities: Normal range of motion.  Edema: None  Mental Status: Normal mood and affect. Normal behavior. Normal judgment and thought content.   Assessment and Plan:  Pregnancy: G2P1001 at [redacted]w[redacted]d  1. Intrauterine growth restriction affecting antepartum care of mother, single or unspecified fetus S<D at 34 cm but reviewed 09/02/20 u/s with AFI wnl, anterior placenta, vtx, EFW=22%, no IUGR  currently at 35 6/7  2. Supervision of other normal pregnancy, antepartum 26 lb (11.8 kg) Knows when to go to L&D Pt desires IOL date 10/06/20 and paperwork completed  3. Major depressive disorder, single episode, moderate (HCC) Denies sxs    Term labor symptoms and general obstetric precautions including but not limited to vaginal bleeding, contractions, leaking of fluid and fetal movement were reviewed in detail with the patient. Please refer to After Visit Summary for other counseling recommendations.  Return in about 1 week (around 09/24/2020) for routine PNC.  No future appointments.  Alberteen Spindle, CNM

## 2020-09-20 ENCOUNTER — Encounter: Payer: Self-pay | Admitting: Obstetrics and Gynecology

## 2020-09-20 ENCOUNTER — Inpatient Hospital Stay
Admission: EM | Admit: 2020-09-20 | Discharge: 2020-09-22 | DRG: 807 | Disposition: A | Discharge status: Home / Self Care | Payer: Medicaid Other | Attending: Obstetrics | Admitting: Obstetrics

## 2020-09-20 ENCOUNTER — Other Ambulatory Visit: Payer: Self-pay

## 2020-09-20 ENCOUNTER — Observation Stay
Admission: EM | Admit: 2020-09-20 | Discharge: 2020-09-20 | Disposition: A | Discharge status: Home / Self Care | Payer: Medicaid Other | Source: Home / Self Care | Admitting: Obstetrics and Gynecology

## 2020-09-20 DIAGNOSIS — Z20822 Contact with and (suspected) exposure to covid-19: Secondary | ICD-10-CM | POA: Diagnosis present

## 2020-09-20 DIAGNOSIS — O471 False labor at or after 37 completed weeks of gestation: Secondary | ICD-10-CM | POA: Insufficient documentation

## 2020-09-20 DIAGNOSIS — O479 False labor, unspecified: Secondary | ICD-10-CM | POA: Diagnosis present

## 2020-09-20 DIAGNOSIS — O99344 Other mental disorders complicating childbirth: Secondary | ICD-10-CM | POA: Diagnosis present

## 2020-09-20 DIAGNOSIS — O99513 Diseases of the respiratory system complicating pregnancy, third trimester: Secondary | ICD-10-CM | POA: Insufficient documentation

## 2020-09-20 DIAGNOSIS — O99824 Streptococcus B carrier state complicating childbirth: Secondary | ICD-10-CM | POA: Diagnosis present

## 2020-09-20 DIAGNOSIS — B951 Streptococcus, group B, as the cause of diseases classified elsewhere: Secondary | ICD-10-CM

## 2020-09-20 DIAGNOSIS — O26893 Other specified pregnancy related conditions, third trimester: Principal | ICD-10-CM | POA: Diagnosis present

## 2020-09-20 DIAGNOSIS — O365990 Maternal care for other known or suspected poor fetal growth, unspecified trimester, not applicable or unspecified: Secondary | ICD-10-CM

## 2020-09-20 DIAGNOSIS — J45909 Unspecified asthma, uncomplicated: Secondary | ICD-10-CM | POA: Insufficient documentation

## 2020-09-20 DIAGNOSIS — Z3A39 39 weeks gestation of pregnancy: Secondary | ICD-10-CM

## 2020-09-20 DIAGNOSIS — F4320 Adjustment disorder, unspecified: Secondary | ICD-10-CM | POA: Diagnosis present

## 2020-09-20 DIAGNOSIS — R200 Anesthesia of skin: Secondary | ICD-10-CM | POA: Diagnosis present

## 2020-09-20 MED ORDER — OXYTOCIN 10 UNIT/ML IJ SOLN
INTRAMUSCULAR | Status: AC
  Filled 2020-09-20: qty 2

## 2020-09-20 MED ORDER — SODIUM CHLORIDE 0.9 % IV SOLN
INTRAVENOUS | Status: AC
  Filled 2020-09-20: qty 2000

## 2020-09-20 MED ORDER — LIDOCAINE HCL (PF) 1 % IJ SOLN
INTRAMUSCULAR | Status: AC
  Filled 2020-09-20: qty 30

## 2020-09-20 MED ORDER — SODIUM CHLORIDE 0.9 % IV SOLN
1.0000 g | INTRAVENOUS | Status: DC
  Administered 2020-09-21: 1 g via INTRAVENOUS
  Filled 2020-09-20: qty 1000

## 2020-09-20 MED ORDER — OXYTOCIN-SODIUM CHLORIDE 30-0.9 UT/500ML-% IV SOLN
INTRAVENOUS | Status: AC
  Filled 2020-09-20: qty 500

## 2020-09-20 MED ORDER — SOD CITRATE-CITRIC ACID 500-334 MG/5ML PO SOLN: 30.0000 mL | ORAL | Status: DC | PRN

## 2020-09-20 MED ORDER — ONDANSETRON HCL 4 MG/2ML IJ SOLN: 4.0000 mg | Freq: Four times a day (QID) | INTRAMUSCULAR | Status: DC | PRN

## 2020-09-20 MED ORDER — AMMONIA AROMATIC IN INHA
RESPIRATORY_TRACT | Status: AC
  Filled 2020-09-20: qty 10

## 2020-09-20 MED ORDER — LACTATED RINGERS IV SOLN
INTRAVENOUS | Status: DC
Start: 2020-09-21 — End: 2020-09-21

## 2020-09-20 MED ORDER — MISOPROSTOL 200 MCG PO TABS
ORAL_TABLET | ORAL | Status: AC
  Filled 2020-09-20: qty 4

## 2020-09-20 MED ORDER — ACETAMINOPHEN 500 MG PO TABS
1000.0000 mg | ORAL_TABLET | Freq: Once | ORAL | Status: AC
  Administered 2020-09-20: 1000 mg via ORAL
  Filled 2020-09-20: qty 2

## 2020-09-20 MED ORDER — FENTANYL CITRATE (PF) 100 MCG/2ML IJ SOLN: 50.0000 ug | INTRAMUSCULAR | Status: DC | PRN

## 2020-09-20 MED ORDER — LIDOCAINE HCL (PF) 1 % IJ SOLN: 30.0000 mL | INTRAMUSCULAR | Status: DC | PRN

## 2020-09-20 MED ORDER — ACETAMINOPHEN 500 MG PO TABS: 1000.0000 mg | ORAL_TABLET | Freq: Four times a day (QID) | ORAL | Status: DC | PRN

## 2020-09-20 MED ORDER — LACTATED RINGERS IV SOLN
500.0000 mL | INTRAVENOUS | Status: DC | PRN
  Administered 2020-09-21: 500 mL via INTRAVENOUS

## 2020-09-20 MED ORDER — OXYTOCIN-SODIUM CHLORIDE 30-0.9 UT/500ML-% IV SOLN
2.5000 [IU]/h | INTRAVENOUS | Status: DC
  Filled 2020-09-20: qty 500

## 2020-09-20 MED ORDER — SODIUM CHLORIDE 0.9 % IV SOLN
2.0000 g | Freq: Once | INTRAVENOUS | Status: AC
  Administered 2020-09-20: 2 g via INTRAVENOUS

## 2020-09-20 MED ORDER — OXYTOCIN BOLUS FROM INFUSION
333.0000 mL | Freq: Once | INTRAVENOUS | Status: AC
  Administered 2020-09-21: 333 mL via INTRAVENOUS

## 2020-09-20 NOTE — H&P (Signed)
OB History & Physical   History of Present Illness:   Chief Complaint: contractions   HPI:  Amber Potter is a 20 y.o. G2P1001 female at [redacted]w[redacted]d dated by [place] at [redacted]w[redacted]d, not c/w LMP of 11/21/2019.  She presents to L&D for regular painful contractions.  She was seen yesterday in L&D for contractions but did not have any cervical change over 2 hours.  She went home to rest and try early labor techniques to help with labor.  Contractions became more intense earlier this evening.  Denies LOF or vaginal bleeding. Endorses good fetal movement.   Reports active fetal movement  Contractions:  every 6 minutes  LOF/SROM: denies  Vaginal bleeding: denies   Factors complicating pregnancy:  GBS positive  Uterine size <D - EFW 25% History depression  History of asthma   Patient Active Problem List   Diagnosis Date Noted   Uterine contractions during pregnancy 09/20/2020   Normal labor 09/20/2020   Positive GBS test 08/31/2020   Intrauterine growth restriction affecting antepartum care of mother 06/10/2020   Supervision of other normal pregnancy, antepartum 02/12/2020   History of asthma 02/12/2020   History of depression 02/12/2020   Illiteracy 01/08/2019   Asthma 10/09/2018   Major depressive disorder, single episode, moderate (HCC) 01/21/2018     Maternal Medical History:   Past Medical History:  Diagnosis Date   Asthma    Depression    No meds   History of asthma    Has inhaler--not used in months    Past Surgical History:  Procedure Laterality Date   Denies surgical history     NO PAST SURGERIES      No Known Allergies  Prior to Admission medications   Medication Sig Start Date End Date Taking? Authorizing Provider  Prenatal Vit-Fe Fumarate-FA (PRENATAL VITAMIN) 27-0.8 MG TABS Take 1 tablet by mouth daily. 08/27/20   Alberteen Spindle, CNM     Prenatal care site:  [facility]  Social History: She  reports that she has never smoked. She has never used smokeless tobacco.  She reports that she does not drink alcohol and does not use drugs.  Family History: family history is not on file.   Review of Systems: A full review of systems was performed and negative except as noted in the HPI.     Physical Exam:  Vital Signs: BP 135/77   Pulse 79   Temp 98.6 F (37 C) (Oral)   Resp 18   Ht 5' (1.524 m)   Wt 66.2 kg   LMP 11/21/2019 (Approximate)   BMI 28.50 kg/m  Physical Exam  General: no acute distress.  HEENT: normocephalic, atraumatic Heart: regular rate & rhythm.  No murmurs/rubs/gallops Lungs: clear to auscultation bilaterally, normal respiratory effort Abdomen: soft, gravid, non-tender;  EFW: 2731grams on 09/02/2020 Pelvic:   External: Normal external female genitalia  Cervix: Dilation: 6 / Effacement (%): 80, 90 / Station: -1    Extremities: non-tender, symmetric, No edema bilaterally.  DTRs: 2+/2+  Neurologic: Alert & oriented x 3.    No results found for this or any previous visit (from the past 24 hour(s)).  Pertinent Results:  Prenatal Labs: Blood type/Rh O pos   Antibody screen neg  Rubella Immune 10/09/2018  Varicella Varivax x 2   RPR NR  HBsAg Neg  HIV NR  GC neg  Chlamydia neg  Genetic screening Quad screen negative   1 hour GTT 128  3 hour GTT N/A  GBS Pos  FHT:  FHR: 135 bpm, variability: moderate,  accelerations:  Present,  decelerations:  Absent Category/reactivity:  Category I UC:   regular, every 5-7 minutes   Cephalic by [place] and SVE   [place] MFM OB FOLLOW UP  Result Date: 09/02/2020 ----------------------------------------------------------------------  OBSTETRICS REPORT                       (Signed Final 09/02/2020 10:07 am) ---------------------------------------------------------------------- Patient Info  ID #:       [id number]                          D.O.B.:  [birth year] (20 yrs)  Name:       Amber Potter [facility]           Visit Date: 09/02/2020 09:42 am  ---------------------------------------------------------------------- Performed By  Attending:        Lin Landsman      Referred By:      Tristan Schroeder GARNER                    MD  Performed By:     Lorn Junes,           [facility]                    RDMS, RDCS                               [facility] at                                                             [facility] ---------------------------------------------------------------------- Orders  #  Description                           Code        Ordered By  1  [place] MFM OB FOLLOW UP                   99357.01    Lin Landsman ----------------------------------------------------------------------  #  Order #                     Accession #                Episode #  1  [id number]                   [id number]                 [id number] ---------------------------------------------------------------------- Indications  Maternal care for known or suspected poor      O36.5930  fetal growth, third trimester, not applicable or  unspecified IUGR  [redacted] weeks gestation of pregnancy  Z3A.37 ---------------------------------------------------------------------- Fetal Evaluation  Num Of Fetuses:         1  Fetal Heart Rate(bpm):  128  Cardiac Activity:       Observed  Presentation:           Cephalic  Placenta:               Anterior  Amniotic Fluid  AFI FV:      Within normal limits  AFI Sum(cm)     %Tile       Largest Pocket(cm)  10.2            25          4.1  RUQ(cm)       RLQ(cm)       LUQ(cm)        LLQ(cm)  2             2.6           4.1            1.5 ---------------------------------------------------------------------- Biometry  BPD:      89.6  mm     G. Age:  36w 2d         45  %    CI:         78.4   %    70 - 86                                                          FL/HC:      21.8   %    20.8 - 22.6  HC:      320.1  mm     G. Age:  36w 1d          9  %     HC/AC:      1.02        0.92 - 1.05  AC:      313.9  mm     G. Age:  35w 2d         18  %    FL/BPD:     78.0   %    71 - 87  FL:       69.9  mm     G. Age:  35w 6d         21  %    FL/AC:      22.3   %    20 - 24  HUM:      59.7  mm     G. Age:  34w 5d         24  %  Est. FW:    2731  gm           6 lb     22  % ---------------------------------------------------------------------- Gestational Age  LMP:           40w 6d        Date:  11/21/19                 EDD:   08/27/20  U/S Today:     35w 6d  EDD:   10/01/20  Best:          37w 0d     Det. By:  U/S C R L  (02/19/20)    EDD:   09/23/20 ---------------------------------------------------------------------- Anatomy  Cranium:               Appears normal         Aortic Arch:            Previously seen  Cavum:                 Appears normal         Ductal Arch:            Previously seen  Ventricles:            Appears normal         Diaphragm:              Previously seen  Choroid Plexus:        Appears normal         Stomach:                Appears normal, left                                                                        sided  Cerebellum:            Previously seen        Abdomen:                Appears normal  Posterior Fossa:       Previously seen        Abdominal Wall:         Previously seen  Nuchal Fold:           Not applicable (>20    Cord Vessels:           Previously seen                         wks GA)  Face:                  Orbits and profile     Kidneys:                Appear normal                         previously seen  Lips:                  Previously seen        Bladder:                Appears normal  Heart:                 Appears normal         Spine:                  Previously seen                         (4CH, axis, and  situs)  RVOT:                  Previously seen        Upper Extremities:      Previously seen  LVOT:                  Previously seen        Lower  Extremities:      Previously seen ---------------------------------------------------------------------- Impression  Follow up growth due to prior IUGR (2 studies ago)  Normal interval growth with measurements consistent with  dates  Good fetal movement and amniotic fluid volume ---------------------------------------------------------------------- Recommendations  Follow up growth as clinically indicated. ----------------------------------------------------------------------               Lin Landsman, MD Electronically Signed Final Report   09/02/2020 10:07 am ----------------------------------------------------------------------   Assessment:  Coleen Cardiff is a 20 y.o. G2P1001 female at [redacted]w[redacted]d with normal labor.   Plan:  1. Admit to Labor & Delivery; consents reviewed and obtained - Covid admission screen   2. Fetal Well being  - Fetal Tracing: cat 1 - Group B Streptococcus ppx pos indicated: GBS indicated  - Presentation: cephalic confirmed by SVE - Cephalic by [place] on 09/02/2020   3. Routine OB: - Prenatal labs reviewed, as above - Rh Pos - CBC, T&S, RPR on admit - Clear fluids, IVF  4. Monitoring of labor  - Contractions monitored with external toco - Pelvis proven to 3459g, adequate for trial of labor  - Plan for expectant management  - Augmentation with oxytocin and AROM as appropriate  - Plan for  continuous fetal monitoring - Maternal pain control as desired; planning regional anesthesia - Anticipate vaginal delivery  5. Post Partum Planning: - Infant feeding: breast  - Contraception: Nexplanon - Tdap vaccine: 06/25/2020 - Flu vaccine: 02/11/2020  Gustavo Lah, CNM 09/20/20 11:55 PM  Margaretmary Eddy, CNM Certified Nurse Midwife [facility]  [facility]

## 2020-09-20 NOTE — Discharge Summary (Signed)
Amber Potter is a 20 y.o. female. She is at [redacted]w[redacted]d gestation. Patient's last menstrual period was 11/21/2019 (approximate). Estimated Date of Delivery: 09/23/20  Prenatal care site: [facility]  Chief complaint: Contractions   HPI: Amber Potter presents to L&D with complaints of contractions that started earlier this evening.  Denies vaginal bleeding or LOF.  Endorses good fetal movement.   Factors complicating pregnancy: GBS positive  Uterine size <D - EFW 25% History depression  History of asthma   S: Resting comfortably. Irregular contractions, no VB.no LOF,  Active fetal movement.   Maternal Medical History:  Past Medical Hx:  has a past medical history of Asthma, Depression, and History of asthma.    Past Surgical Hx:  has a past surgical history that includes No past surgeries and Denies surgical history.   No Known Allergies   Prior to Admission medications   Medication Sig Start Date End Date Taking? Authorizing Provider  Prenatal Vit-Fe Fumarate-FA (PRENATAL VITAMIN) 27-0.8 MG TABS Take 1 tablet by mouth daily. 08/27/20  Yes Sciora, Austin Miles, CNM    Social History: She  reports that she has never smoked. She has never used smokeless tobacco. She reports that she does not drink alcohol and does not use drugs.  Family History: Family history non-contributory ,no history of gyn cancers  Review of Systems: A full review of systems was performed and negative except as noted in the HPI.    O:  BP 119/66 (BP Location: Left Arm)   Pulse 74   Temp 98.4 F (36.9 C) (Oral)   Resp 16   Ht 5' (1.524 m)   Wt 66.2 kg   LMP 11/21/2019 (Approximate)   BMI 28.51 kg/m  No results found for this or any previous visit (from the past 48 hour(s)).   Constitutional: NAD, AAOx3  HE/ENT: extraocular movements grossly intact, moist mucous membranes CV: RRR PULM: nl respiratory effort, CTABL Abd: gravid, non-tender, non-distended, soft  Ext: Non-tender, Nonedmeatous Psych: mood  appropriate, speech normal SVE: Dilation: 4 Effacement (%): 50, 60 Cervical Position: Posterior Station: -3 Exam by:: A Mining engineer  -No cervical change after 2 hours   Fetal Monitor: Baseline: 125 bpm Variability: moderate Accels: Present Decels: none Toco: 5-9 minutes, mild contractions   Category: I   Assessment: 20 y.o. [redacted]w[redacted]d here for antenatal surveillance during pregnancy.  Principle diagnosis: Early labor     Plan: Labor: not present.  Fetal Wellbeing: Reassuring Cat 1 tracing. Reactive NST  Labor precautions reviewed  Encouraged to return when contractions become more intense or closer together, LOF, vaginal bleeding or decreased fetal movement   Comfort measures reviewed  D/c home stable, precautions reviewed, follow-up as scheduled.   ----- Margaretmary Eddy, CNM Certified Nurse Midwife [facility]

## 2020-09-20 NOTE — OB Triage Note (Signed)
Amber Potter is a 20yo G2P1, 39w 4d. She arrived to the unit with complaints of contractions.  She denies vaginal bleeding, reports positive fetal movement, and reports contractions every 8 minutes. VS stable, monitors applied and assessing.   Initial FHT 135 at [facility]

## 2020-09-20 NOTE — OB Triage Note (Signed)
Pt G3P2 [redacted]w[redacted]d presents to bp w/ c/o ctx every 6 minutes. Reports no vag bleeding, no LOF and +FM. Reports 7/10 pain. Monitors applied and assessing.

## 2020-09-21 ENCOUNTER — Inpatient Hospital Stay: Payer: Medicaid Other | Admitting: Anesthesiology

## 2020-09-21 ENCOUNTER — Encounter: Payer: Self-pay | Admitting: Obstetrics and Gynecology

## 2020-09-21 DIAGNOSIS — F4320 Adjustment disorder, unspecified: Secondary | ICD-10-CM

## 2020-09-21 LAB — CBC
HCT: 37 % (ref 36.0–46.0)
Hemoglobin: 12.7 g/dL (ref 12.0–15.0)
MCH: 29.7 pg (ref 26.0–34.0)
MCHC: 34.3 g/dL (ref 30.0–36.0)
MCV: 86.7 fL (ref 80.0–100.0)
Platelets: 332 10*3/uL (ref 150–400)
RBC: 4.27 MIL/uL (ref 3.87–5.11)
RDW: 13.8 % (ref 11.5–15.5)
WBC: 14.5 10*3/uL — ABNORMAL HIGH (ref 4.0–10.5)
nRBC: 0 % (ref 0.0–0.2)

## 2020-09-21 LAB — RESP PANEL BY RT-PCR (FLU A&B, COVID) ARPGX2
Influenza A by PCR: NEGATIVE
Influenza B by PCR: NEGATIVE
SARS Coronavirus 2 by RT PCR: NEGATIVE

## 2020-09-21 LAB — TYPE AND SCREEN
ABO/RH(D): O POS
Antibody Screen: NEGATIVE

## 2020-09-21 LAB — RPR: RPR Ser Ql: NONREACTIVE

## 2020-09-21 MED ORDER — ONDANSETRON HCL 4 MG PO TABS
4.0000 mg | ORAL_TABLET | ORAL | Status: DC | PRN
  Filled 2020-09-21: qty 1

## 2020-09-21 MED ORDER — DIPHENHYDRAMINE HCL 25 MG PO CAPS: 25.0000 mg | ORAL_CAPSULE | Freq: Four times a day (QID) | ORAL | Status: DC | PRN

## 2020-09-21 MED ORDER — PRENATAL MULTIVITAMIN CH
1.0000 | ORAL_TABLET | Freq: Every day | ORAL | Status: DC
  Administered 2020-09-21 – 2020-09-22 (×2): 1 via ORAL
  Filled 2020-09-21 (×2): qty 1

## 2020-09-21 MED ORDER — FENTANYL-BUPIVACAINE-NACL 0.5-0.125-0.9 MG/250ML-% EP SOLN
EPIDURAL | Status: DC | PRN
  Administered 2020-09-21: 12 mL/h via EPIDURAL

## 2020-09-21 MED ORDER — IBUPROFEN 600 MG PO TABS
600.0000 mg | ORAL_TABLET | Freq: Four times a day (QID) | ORAL | Status: DC
Start: 2020-09-21 — End: 2020-09-22
  Administered 2020-09-21 – 2020-09-22 (×5): 600 mg via ORAL
  Filled 2020-09-21 (×5): qty 1

## 2020-09-21 MED ORDER — WITCH HAZEL-GLYCERIN EX PADS
1.0000 "application " | MEDICATED_PAD | CUTANEOUS | Status: DC
  Administered 2020-09-21: 1 via TOPICAL
  Filled 2020-09-21: qty 100

## 2020-09-21 MED ORDER — OXYTOCIN-SODIUM CHLORIDE 30-0.9 UT/500ML-% IV SOLN: 1.0000 m[IU]/min | INTRAVENOUS | Status: DC

## 2020-09-21 MED ORDER — ONDANSETRON HCL 4 MG/2ML IJ SOLN: 4.0000 mg | INTRAMUSCULAR | Status: DC | PRN

## 2020-09-21 MED ORDER — BUPIVACAINE HCL (PF) 0.25 % IJ SOLN
INTRAMUSCULAR | Status: DC | PRN
  Administered 2020-09-21 (×2): 5 mL via EPIDURAL

## 2020-09-21 MED ORDER — LIDOCAINE HCL (PF) 1 % IJ SOLN
INTRAMUSCULAR | Status: DC | PRN
  Administered 2020-09-21: 2 mL via EPIDURAL

## 2020-09-21 MED ORDER — DOCUSATE SODIUM 100 MG PO CAPS
100.0000 mg | ORAL_CAPSULE | Freq: Two times a day (BID) | ORAL | Status: DC
  Administered 2020-09-21 – 2020-09-22 (×2): 100 mg via ORAL
  Filled 2020-09-21 (×2): qty 1

## 2020-09-21 MED ORDER — ACETAMINOPHEN 500 MG PO TABS
1000.0000 mg | ORAL_TABLET | Freq: Four times a day (QID) | ORAL | Status: DC | PRN
  Administered 2020-09-22: 1000 mg via ORAL
  Filled 2020-09-21: qty 2

## 2020-09-21 MED ORDER — COCONUT OIL OIL
1.0000 "application " | TOPICAL_OIL | Status: DC | PRN
  Administered 2020-09-21: 1 via TOPICAL
  Filled 2020-09-21 (×2): qty 120

## 2020-09-21 MED ORDER — TERBUTALINE SULFATE 1 MG/ML IJ SOLN: 0.2500 mg | Freq: Once | INTRAMUSCULAR | Status: DC | PRN

## 2020-09-21 MED ORDER — SIMETHICONE 80 MG PO CHEW: 80.0000 mg | CHEWABLE_TABLET | ORAL | Status: DC | PRN

## 2020-09-21 MED ORDER — FENTANYL-BUPIVACAINE-NACL 0.5-0.125-0.9 MG/250ML-% EP SOLN
EPIDURAL | Status: AC
  Filled 2020-09-21: qty 250

## 2020-09-21 MED ORDER — DIBUCAINE (PERIANAL) 1 % EX OINT: 1.0000 "application " | TOPICAL_OINTMENT | CUTANEOUS | Status: DC | PRN

## 2020-09-21 MED ORDER — BENZOCAINE-MENTHOL 20-0.5 % EX AERO
1.0000 "application " | INHALATION_SPRAY | CUTANEOUS | Status: DC | PRN
  Administered 2020-09-21: 1 via TOPICAL
  Filled 2020-09-21: qty 56

## 2020-09-21 NOTE — Progress Notes (Signed)
Labor Progress Note Interpreter used for all communication   Amber Potter is a 20 y.o. G2P1001 at [redacted]w[redacted]d by ultrasound admitted for active labor  Subjective: feeling more comfortable after epidural  Objective: BP (!) 97/56   Pulse 81   Temp 98.6 F (37 C) (Oral)   Resp 18   Ht 5' (1.524 m)   Wt 66.2 kg   LMP 11/21/2019 (Approximate)   SpO2 95%   BMI 28.50 kg/m  Notable VS details: reviewed   Fetal Assessment: FHT:  FHR: 130 bpm, variability: moderate,  accelerations:  Present,  decelerations:  Absent Category/reactivity:  Category I UC:   regular, every 5-7 minutes SVE:   7/90/+1/soft/anterior  Membrane status: AROM at 0351 Amniotic color: Thick mec   Labs: Lab Results  Component Value Date   WBC 14.5 (H) 09/20/2020   HGB 12.7 09/20/2020   HCT 37.0 09/20/2020   MCV 86.7 09/20/2020   PLT 332 09/20/2020    Assessment / Plan: Spontaneous labor, progressing normally -Will recheck cervix in 2 hours  -Start augmentation with low dose oxytocin if minimally changed  -Flat affect noted - repeated occurrences of flat affect noted during prenatal visits.  Plan for transition of care team to evaluate.    Labor: Progressing normally Fetal Wellbeing:  Category I Pain Control:  Epidural I/D:   GBS pos, 2nd dose of amp given Anticipated MOD:  NSVD  Gustavo Lah, CNM 09/21/2020, 4:02 AM

## 2020-09-21 NOTE — Anesthesia Procedure Notes (Addendum)
Epidural  Start time: 09/21/2020 12:20 AM End time: 09/21/2020 12:40 AM  Staffing Anesthesiologist: Foye Deer, MD Performed: anesthesiologist   Preanesthetic Checklist Completed: patient identified, IV checked, site marked, risks and benefits discussed, surgical consent, monitors and equipment checked, pre-op evaluation and timeout performed  Epidural Patient position: sitting Prep: ChloraPrep Patient monitoring: heart rate, cardiac monitor, continuous pulse ox and blood pressure Approach: midline Location: L3-L4 Injection technique: LOR saline  Needle:  Needle type: Tuohy  Needle gauge: 17 G Needle length: 9 cm Needle insertion depth: 5 cm Catheter size: 19 Gauge Catheter at skin depth: 9 cm Test dose: Other and negative  Additional Notes Reason for block:procedure for pain

## 2020-09-21 NOTE — Progress Notes (Signed)
Upon arrival, pt appeared flat in affect but makes eye contact and responds to all commands-oriented x 4. Pt holding infant skin to skin- still showing minimal interest in care of baby without prompting. Pt responded well to in person interpreter with a smile and nods. Pt later responded to nursing staff with smiles and nods. She ate lunch. Support person at bedside-communicated appropriately with staff and showing interest in care of baby (changing diapers, asking to hold, etc).  Pt states she is still experiencing numbness of legs. Corene Cornea used to get patient to bathroom. Pt able to empty bladder, lift legs and hips, and hold her own weight up when assisted to standing position. M/B RN notified to continue to monitor with next ambulation.

## 2020-09-21 NOTE — Anesthesia Preprocedure Evaluation (Signed)
Anesthesia Evaluation  Patient identified by MRN, date of birth, ID band Patient awake    Reviewed: Allergy & Precautions, H&P , NPO status , Patient's Chart, lab work & pertinent test results  Airway Mallampati: III  TM Distance: >3 FB Neck ROM: full    Dental  (+) Teeth Intact   Pulmonary asthma ,    Pulmonary exam normal        Cardiovascular negative cardio ROS Normal cardiovascular exam     Neuro/Psych negative neurological ROS     GI/Hepatic negative GI ROS, Neg liver ROS, GERD: Well controlled.  ,  Endo/Other  negative endocrine ROS  Renal/GU negative Renal ROS     Musculoskeletal negative musculoskeletal ROS (+)   Abdominal   Peds  Hematology negative hematology ROS (+)   Anesthesia Other Findings Interpreter used for H&P/epidural placement  Reproductive/Obstetrics (+) Pregnancy                             Anesthesia Physical  Anesthesia Plan  ASA: II  Anesthesia Plan: Epidural   Post-op Pain Management:    Induction:   PONV Risk Score and Plan:   Airway Management Planned:   Additional Equipment:   Intra-op Plan:   Post-operative Plan:   Informed Consent: I have reviewed the patients History and Physical, chart, labs and discussed the procedure including the risks, benefits and alternatives for the proposed anesthesia with the patient or authorized representative who has indicated his/her understanding and acceptance.     Dental Advisory Given  Plan Discussed with: Anesthesiologist and CRNA  Anesthesia Plan Comments:         Anesthesia Quick Evaluation

## 2020-09-21 NOTE — Discharge Summary (Addendum)
Obstetrical Discharge Summary  Patient Name: Amber Potter DOB: [birth year] MRN: [id number]  Date of Admission: 09/20/2020 Date of Delivery: 09/21/2020 Delivered by: Margaretmary Eddy, CNM  Date of Discharge: 09/22/2020  [id number] last menstrual period was 11/21/2019 (approximate). EDC Estimated Date of Delivery: 09/23/20 Gestational Age at Delivery: [redacted]w[redacted]d   Antepartum complications:  GBS positive  Uterine size <D - EFW 25% History depression  History of asthma   Admitting Diagnosis: Normal labor  Secondary Diagnosis: Patient Active Problem List   Diagnosis Date Noted   Adjustment disorder 09/21/2020   Uterine contractions during pregnancy 09/20/2020   Normal labor 09/20/2020   Positive GBS test 08/31/2020   Intrauterine growth restriction affecting antepartum care of mother 06/10/2020   Supervision of other normal pregnancy, antepartum 02/12/2020   History of asthma 02/12/2020   History of depression 02/12/2020   Illiteracy 01/08/2019   Asthma 10/09/2018   Major depressive disorder, single episode, moderate (HCC) 01/21/2018    Augmentation: AROM for meconium stained fluid  Complications: None Intrapartum complications/course: Minie presented to L&D with regular painful contractions.  She received an epidural and then was augmented with AROM after the 2nd dose of ampicillin for GBS prophylaxis.  She progressed spontaneously after AROM to C/C/+3 and pushed quickly over 1 contraction for a spontaneous birth. Delivery Type: spontaneous vaginal delivery Anesthesia: epidural Placenta: spontaneous Laceration: 1st perineal - no repair indicated, hemostatic and approximates well Episiotomy: none Newborn Data: Live born female "" Birth Weight: 6 lb 6.3 oz (2900 g) APGAR: 8, 9  Newborn Delivery   Birth date/time: 09/21/2020 06:03:00 Delivery type: Vaginal, Spontaneous      Postpartum Procedures: none Edinburgh:  Edinburgh Postnatal  Depression Scale Screening Tool 09/22/2020 06/09/2019 04/24/2019 04/23/2019  I have been able to laugh and see the funny side of things. 3 0 0 (No Data)  I have looked forward with enjoyment to things. 0 0 0 -  I have blamed myself unnecessarily when things went wrong. 0 0 3 -  I have been anxious or worried for no good reason. 0 0 0 -  I have felt scared or panicky for no good reason. 0 0 2 -  Things have been getting on top of me. 0 0 0 -  I have been so unhappy that I have had difficulty sleeping. 0 3 0 -  I have felt sad or miserable. 0 0 0 -  I have been so unhappy that I have been crying. 0 3 1 -  The thought of harming myself has occurred to me. 0 0 0 -  Edinburgh Postnatal Depression Scale Total 3 6 6  -     Post partum course:  Patient had an uncomplicated postpartum course.  By time of discharge on PPD#1, her pain was controlled on oral pain medications; she had appropriate lochia and was ambulating, voiding without difficulty and tolerating regular diet.  She was evaluated by Psych and TOC team and was deemed stable for discharge to home.      Discharge Physical Exam:  BP 113/61 (BP Location: Left Arm)   Pulse 65   Temp 98.2 F (36.8 C) (Oral)   Resp 20   Ht 5' (1.524 m)   Wt 66.2 kg   LMP 11/21/2019 (Approximate)   SpO2 99%   Breastfeeding Unknown   BMI 28.50 kg/m  Physical Exam Psychiatric:        Attention and Perception: Attention normal.        Mood and  Affect: Mood is depressed. Affect is labile and flat.        Speech: Speech normal.        Behavior: Behavior is withdrawn. Behavior is cooperative.        Thought Content: Thought content normal.        Cognition and Memory: Cognition normal.        Judgment: Judgment normal.    General: NAD CV: RRR Pulm: CTABL, nl effort ABD: s/nd/nt, fundus firm and below the umbilicus Lochia: moderate Perineum:minimal edema/intact DVT Evaluation: LE non-ttp, no evidence of DVT on exam.  Hemoglobin  Date Value Ref Range  Status  09/22/2020 12.2 12.0 - 15.0 g/dL Final  60/73/7106 26.9 11.1 - 15.9 g/dL Final   HCT  Date Value Ref Range Status  09/22/2020 38.0 36.0 - 46.0 % Final   Hematocrit  Date Value Ref Range Status  02/11/2020 40.5 34.0 - 46.6 % Final     Disposition: stable, discharge to home. Baby Feeding: breastmilk and formula Baby Disposition: home with mom  Rh Immune globulin given: Rh pos Rubella vaccine given: Immune Varivax vaccine given: Varivax x 2  Flu vaccine given in AP or PP setting: 02/11/20 Tdap vaccine given in AP or PP setting: 06/25/20  Contraception: Nexplanon   Prenatal Labs:  Blood type/Rh O pos   Antibody screen neg  Rubella Immune 10/09/2018  Varicella Varivax x 2   RPR NR  HBsAg Neg  HIV NR  GC neg  Chlamydia neg  Genetic screening Quad screen negative   1 hour GTT 128  3 hour GTT N/A  GBS Pos    Plan:  [name] was discharged to home in good condition. Follow-up appointment with prenatal provider in 1 weeks.  Discharge Medications: Allergies as of 09/22/2020   No Known Allergies      Medication List     TAKE these medications    acetaminophen 500 MG tablet Commonly known as: TYLENOL Take 2 tablets (1,000 mg total) by mouth every 6 (six) hours as needed (for pain scale < 4).   benzocaine-Menthol 20-0.5 % Aero Commonly known as: DERMOPLAST Apply 1 application topically as needed for irritation (perineal discomfort).   coconut oil Oil Apply 1 application topically as needed.   dibucaine 1 % Oint Commonly known as: NUPERCAINAL Place 1 application rectally as needed for hemorrhoids.   docusate sodium 100 MG capsule Commonly known as: COLACE Take 1 capsule (100 mg total) by mouth 2 (two) times daily.   ibuprofen 600 MG tablet Commonly known as: ADVIL Take 1 tablet (600 mg total) by mouth every 6 (six) hours.   Prenatal Vitamin 27-0.8 MG Tabs Take 1 tablet by mouth daily.   witch hazel-glycerin pad Commonly known as:  TUCKS Apply 1 application topically continuous.         Follow-up Information     [facility] DEPT Follow up in 2 week(s).   Why: postpartum mood check Contact information: [address] [place] [address] [phone]        [facility] HEALTH DEPT Follow up in 6 week(s).   Why: postpartum visit and IUD placement Contact information: [address] [place] [address] [phone]                Signed: Chari Manning CNM

## 2020-09-21 NOTE — TOC Initial Note (Signed)
Transition of Care [facility]) - Initial/Assessment Note    Patient Details  Name: Amber Potter MRN: [id number] Date of Birth: [birth year]  Transition of Care [facility]) CM/SW Contact:    Hetty Ely, RN Phone Number: 09/21/2020, 4:19 PM  Clinical Narrative:  Spoke with MOB while breast feeding baby with [place] Interpreter Jacqui. MOB says this is her second baby, the first one is 68mos. Currently lives with sister who is 22yo. Father of baby provides financial needs and she feels safe at home. MOB denies history of anxiety or postpartum depression. Given [place] Post Partum resources brochure and also phone number for [facility] services. MOB says she received [facility] services with first child and will call to schedule appointment for newborn. MOB denies any concerns or needs for the baby at this time, says she has everything she needs to care for the baby. Hospital Psychiatrist, Interpreter and Nurse was in the room discussing anxiety prior to [facility]. No barriers of concern identified at this time, MOB with flat affect, however no safety concerns. Can discharge when medically stable.               Patient Goals and CMS Choice        Expected Discharge Plan and Services                                                Prior Living Arrangements/Services                       Activities of Daily Living Home Assistive Devices/Equipment: None ADL Screening (condition at time of admission) Patient's cognitive ability adequate to safely complete daily activities?: No Is the patient deaf or have difficulty hearing?: No Does the patient have difficulty seeing, even when wearing glasses/contacts?: No Does the patient have difficulty concentrating, remembering, or making decisions?: No Patient able to express need for assistance with ADLs?: Yes Does the patient have difficulty dressing or bathing?: No Independently performs ADLs?: Yes (appropriate for developmental age) Does  the patient have difficulty walking or climbing stairs?: No Weakness of Legs: None Weakness of Arms/Hands: None  Permission Sought/Granted                  Emotional Assessment              Admission diagnosis:  Normal labor [O80, Z37.9] Patient Active Problem List   Diagnosis Date Noted   Uterine contractions during pregnancy 09/20/2020   Normal labor 09/20/2020   Positive GBS test 08/31/2020   Intrauterine growth restriction affecting antepartum care of mother 06/10/2020   Supervision of other normal pregnancy, antepartum 02/12/2020   History of asthma 02/12/2020   History of depression 02/12/2020   Illiteracy 01/08/2019   Asthma 10/09/2018   Major depressive disorder, single episode, moderate (HCC) 01/21/2018   PCP:  Patient, No Pcp Per (Inactive) Pharmacy:   [id number] [place], [place] - [address] AT [facility] [address] [place] [address] Phone: [phone] Fax: [phone]     Social Determinants of Health (SDOH) Interventions    Readmission Risk Interventions No flowsheet data found.

## 2020-09-21 NOTE — Consult Note (Signed)
[facility] Face-to-Face Psychiatry Consult   Reason for Consult: Consult for 20 year old woman with recent delivery of a baby.  Concern about depression Referring Physician: Tami Lin Patient Identification: Amber Potter MRN:  [id number] Principal Diagnosis: Adjustment disorder Diagnosis:  Principal Problem:   Adjustment disorder Active Problems:   Normal labor   Total Time spent with patient: 1 hour  Subjective:   Amber Potter is a 20 y.o. female patient admitted with "I am fine".  HPI: Patient seen with the assistance of a hospital provided professional Spanish language interpreter.  20 year old woman in the hospital having just delivered her second child.  Staff working with her were concerned by her affect and behavior and thought that she may be depressed.  Patient has no chief complaint.  She says that her mood has been fine.  She says this afternoon she is feeling a little nervous and frightened because her baby had a brief episode of apnea but prior to that and other than that she has been feeling well.  Denies feeling depressed.  Says that her pregnancy had gone fine.  Denies any sleep or appetite problems.  Denies any difficulty bonding with the child.  Denies any psychotic symptoms.  Denies suicidal thought.  Not receiving any outpatient mental health treatment.  Patient does have a blunted affect and speaks very little during the interview.  Staff have noticed that she appears to be emotionally withdrawn and have expressed some concern about how she is bonding with the child.  No report however of any violent or dangerous behavior.  Past Psychiatric History: Patient apparently has been diagnosed with depression in the past.  She was very vague about the information just saying that it occurred with her previous pregnancy.  Looking back at the old notes does not look like she ever agreed to engage in any mental health treatment.  Depression was identified as a potential problem  with her last pregnancy but she declined medication or therapy.  There is a mention in 1 note that she was briefly in a hospital in [place] possibly for depression but was never on any medicine.  Denies any history of suicide attempts psychosis or violence.  Denies substance abuse.  Risk to Self: Is patient at risk for suicide?: No Risk to Others:   Prior Inpatient Therapy:   Prior Outpatient Therapy:    Past Medical History:  Past Medical History:  Diagnosis Date   Asthma    Depression    No meds   History of asthma    Has inhaler--not used in months    Past Surgical History:  Procedure Laterality Date   Denies surgical history     NO PAST SURGERIES     Family History:  Family History  Problem Relation Age of Onset   Cancer Neg Hx    Diabetes Neg Hx    Hypertension Neg Hx    Family Psychiatric  History: Denies any.  Patient is living here in [place] with her domestic partner who is the father of both of the children.  Says she has no other relatives anywhere close by. Social History:  Social History   Substance and Sexual Activity  Alcohol Use Never     Social History   Substance and Sexual Activity  Drug Use Never    Social History   Socioeconomic History   Marital status: Single    Spouse name: Jose    Number of children: 1   Years of education: 6   Highest  education level: 6th grade  Occupational History   Occupation: unemployed  Tobacco Use   Smoking status: Never   Smokeless tobacco: Never   Tobacco comments:    no passive exposure  Vaping Use   Vaping Use: Never used  Substance and Sexual Activity   Alcohol use: Never   Drug use: Never   Sexual activity: Yes    Birth control/protection: Injection, None    Comment: Last Depo 02/2018, no bcm since depo  Other Topics Concern   Not on file  Social History Narrative   Not on file   Social Determinants of Health   Financial Resource Strain: Low Risk    Difficulty of Paying Living Expenses: Not  hard at all  Food Insecurity: No Food Insecurity   Worried About Programme researcher, broadcasting/film/video in the Last Year: Never true   Ran Out of Food in the Last Year: Never true  Transportation Needs: No Transportation Needs   Lack of Transportation (Medical): No   Lack of Transportation (Non-Medical): No  Physical Activity: Not on file  Stress: Not on file  Social Connections: Not on file   Additional Social History:    Allergies:  No Known Allergies  Labs:  Results for orders placed or performed during the hospital encounter of 09/20/20 (from the past 48 hour(s))  CBC     Status: Abnormal   Collection Time: 09/20/20 11:49 PM  Result Value Ref Range   WBC 14.5 (H) 4.0 - 10.5 K/uL   RBC 4.27 3.87 - 5.11 MIL/uL   Hemoglobin 12.7 12.0 - 15.0 g/dL   HCT 86.5 78.4 - 69.6 %   MCV 86.7 80.0 - 100.0 fL   MCH 29.7 26.0 - 34.0 pg   MCHC 34.3 30.0 - 36.0 g/dL   RDW 29.5 28.4 - 13.2 %   Platelets 332 150 - 400 K/uL   nRBC 0.0 0.0 - 0.2 %    Comment: Performed at [facility], [address] [place], [place] [address]  RPR     Status: None   Collection Time: 09/20/20 11:49 PM  Result Value Ref Range   RPR Ser Ql NON REACTIVE NON REACTIVE    Comment: Performed at [facility], [address], [place], [place] [address]  Resp Panel by RT-PCR (Flu A&B, Covid) Nasopharyngeal Swab     Status: None   Collection Time: 09/20/20 11:49 PM   Specimen: Nasopharyngeal Swab; Nasopharyngeal(NP) swabs in vial transport medium  Result Value Ref Range   SARS Coronavirus 2 by RT PCR NEGATIVE NEGATIVE    Comment: (NOTE) SARS-CoV-2 target nucleic acids are NOT DETECTED.  The SARS-CoV-2 RNA is generally detectable in upper respiratory specimens during the acute phase of infection. The lowest concentration of SARS-CoV-2 viral copies this assay can detect is 138 copies/mL. A negative result does not preclude SARS-Cov-2 infection and should not be used as the sole basis for treatment or other patient  management decisions. A negative result may occur with  improper specimen collection/handling, submission of specimen other than nasopharyngeal swab, presence of viral mutation(s) within the areas targeted by this assay, and inadequate number of viral copies(<138 copies/mL). A negative result must be combined with clinical observations, patient history, and epidemiological information. The expected result is Negative.  Fact Sheet for Patients:  BloggerCourse.com  Fact Sheet for Healthcare Providers:  SeriousBroker.it  This test is no t yet approved or cleared by the [place] FDA and  has been authorized for detection and/or diagnosis  of SARS-CoV-2 by FDA under an Emergency Use Authorization (EUA). This EUA will remain  in effect (meaning this test can be used) for the duration of the COVID-19 declaration under Section 564(b)(1) of the Act, 21 U.S.C.section 360bbb-3(b)(1), unless the authorization is terminated  or revoked sooner.       Influenza A by PCR NEGATIVE NEGATIVE   Influenza B by PCR NEGATIVE NEGATIVE    Comment: (NOTE) The Xpert Xpress SARS-CoV-2/FLU/RSV plus assay is intended as an aid in the diagnosis of influenza from Nasopharyngeal swab specimens and should not be used as a sole basis for treatment. Nasal washings and aspirates are unacceptable for Xpert Xpress SARS-CoV-2/FLU/RSV testing.  Fact Sheet for Patients: BloggerCourse.comhttps://www.fda.gov/media/152166/download  Fact Sheet for Healthcare Providers: SeriousBroker.ithttps://www.fda.gov/media/152162/download  This test is not yet approved or cleared by the [place] FDA and has been authorized for detection and/or diagnosis of SARS-CoV-2 by FDA under an Emergency Use Authorization (EUA). This EUA will remain in effect (meaning this test can be used) for the duration of the COVID-19 declaration under Section 564(b)(1) of the Act, 21 U.S.C. section 360bbb-3(b)(1), unless the  authorization is terminated or revoked.  Performed at [facility], [address] [place], [place] [address]   Type and screen     Status: None   Collection Time: 09/21/20  1:02 AM  Result Value Ref Range   ABO/RH(D) O POS    Antibody Screen NEG    Sample Expiration      09/24/2020,2359 Performed at [facility], [address] [place], [place] [address]     Current Facility-Administered Medications  Medication Dose Route Frequency Provider Last Rate Last Admin   acetaminophen (TYLENOL) tablet 1,000 mg  1,000 mg Oral Q6H PRN Margaretmary EddyMackie, Anna M, CNM       benzocaine-Menthol (DERMOPLAST) 20-0.5 % topical spray 1 application  1 application Topical PRN Gustavo LahMackie, Anna M, CNM   1 application at 09/21/20 1143   coconut oil  1 application Topical PRN Gustavo LahMackie, Anna M, CNM   1 application at 09/21/20 1144   witch hazel-glycerin (TUCKS) pad 1 application  1 application Topical Continuous Gustavo LahMackie, Anna M, CNM   1 application at 09/21/20 1143   And   dibucaine (NUPERCAINAL) 1 % rectal ointment 1 application  1 application Rectal PRN Gustavo LahMackie, Anna M, CNM       diphenhydrAMINE (BENADRYL) capsule 25 mg  25 mg Oral Q6H PRN Margaretmary EddyMackie, Anna M, CNM       docusate sodium (COLACE) capsule 100 mg  100 mg Oral BID Margaretmary EddyMackie, Anna M, CNM       ibuprofen (ADVIL) tablet 600 mg  600 mg Oral Q6H Gustavo LahMackie, Anna M, CNM   600 mg at 09/21/20 1144   ondansetron (ZOFRAN) tablet 4 mg  4 mg Oral Q4H PRN Gustavo LahMackie, Anna M, CNM       Or   ondansetron [facility](ZOFRAN) injection 4 mg  4 mg Intravenous Q4H PRN Gustavo LahMackie, Anna M, CNM       oxytocin (PITOCIN) IV infusion 30 units in NS 500 mL - Premix  2.5 Units/hr Intravenous Continuous Gustavo LahMackie, Anna M, CNM 41.7 mL/hr at 09/21/20 0634 2.5 Units/hr at 09/21/20 0634   prenatal multivitamin tablet 1 tablet  1 tablet Oral Q1200 Gustavo LahMackie, Anna M, CNM   1 tablet at 09/21/20 1144   simethicone (MYLICON) chewable tablet 80 mg  80 mg Oral PRN Gustavo LahMackie, Anna M, CNM         Musculoskeletal: Strength & Muscle Tone: within normal  limits Gait & Station: normal Patient leans: N/A            Psychiatric Specialty Exam:  Presentation  General Appearance:  No data recorded Eye Contact: No data recorded Speech: No data recorded Speech Volume: No data recorded Handedness: No data recorded  Mood and Affect  Mood: No data recorded Affect: No data recorded  Thought Process  Thought Processes: No data recorded Descriptions of Associations:No data recorded Orientation:No data recorded Thought Content:No data recorded History of Schizophrenia/Schizoaffective disorder:No data recorded Duration of Psychotic Symptoms:No data recorded Hallucinations:No data recorded Ideas of Reference:No data recorded Suicidal Thoughts:No data recorded Homicidal Thoughts:No data recorded  Sensorium  Memory: No data recorded Judgment: No data recorded Insight: No data recorded  Executive Functions  Concentration: No data recorded Attention Span: No data recorded Recall: No data recorded Fund of Knowledge: No data recorded Language: No data recorded  Psychomotor Activity  Psychomotor Activity: No data recorded  Assets  Assets: No data recorded  Sleep  Sleep: No data recorded  Physical Exam: Physical Exam Vitals and nursing note reviewed.  Constitutional:      Appearance: Normal appearance.  HENT:     Head: Normocephalic and atraumatic.     Mouth/Throat:     Pharynx: Oropharynx is clear.  Eyes:     Pupils: Pupils are equal, round, and reactive to light.  Cardiovascular:     Rate and Rhythm: Normal rate and regular rhythm.  Pulmonary:     Effort: Pulmonary effort is normal.     Breath sounds: Normal breath sounds.  Abdominal:     General: Abdomen is flat.     Palpations: Abdomen is soft.  Musculoskeletal:        General: Normal range of motion.  Skin:    General: Skin is warm and dry.  Neurological:     General: No  focal deficit present.     Mental Status: She is alert. Mental status is at baseline.  Psychiatric:        Attention and Perception: Attention normal.        Mood and Affect: Mood normal. Affect is blunt.        Speech: Speech is delayed.        Behavior: Behavior is slowed.        Thought Content: Thought content normal. Thought content does not include homicidal or suicidal ideation.   Review of Systems  Constitutional: Negative.   HENT: Negative.    Eyes: Negative.   Respiratory: Negative.    Cardiovascular: Negative.   Gastrointestinal: Negative.   Musculoskeletal: Negative.   Skin: Negative.   Neurological: Negative.   Psychiatric/Behavioral: Negative.    Blood pressure 92/63, pulse 73, temperature 98.5 F (36.9 C), temperature source Oral, resp. rate 20, height 5' (1.524 m), weight 66.2 kg, last menstrual period 11/21/2019, SpO2 98 %, unknown if currently breastfeeding. Body mass index is 28.5 kg/m.  Treatment Plan Summary: Plan 20 year old woman who does have an affect and some behaviors that suggest the possibility of depression.  Patient is denying any symptoms of depression.  Has not shown any dangerous behavior.  Spent some time educating the patient about the ubiquitous nature of postpartum depression and encouraging her to seek help.  Tried to make it clear that help was available both therapy and potentially medication management.  She will be given information about Spanish language mental health resources prior to discharge and was also encouraged to share any concerns with her doctors or any other physicians  after discharge.  Patient expresses understanding.  No indication can be made to start any medicine at this point.  Disposition: No evidence of imminent risk to self or others at present.   Patient does not meet criteria for psychiatric inpatient admission. Supportive therapy provided about ongoing stressors. Discussed crisis plan, support from social network,  calling 911, coming to the Emergency Department, and calling Suicide Hotline.  Mordecai Rasmussen, MD 09/21/2020 4:40 PM

## 2020-09-22 LAB — CBC
HCT: 38 % (ref 36.0–46.0)
Hemoglobin: 12.2 g/dL (ref 12.0–15.0)
MCH: 28.6 pg (ref 26.0–34.0)
MCHC: 32.1 g/dL (ref 30.0–36.0)
MCV: 89.2 fL (ref 80.0–100.0)
Platelets: 199 10*3/uL (ref 150–400)
RBC: 4.26 MIL/uL (ref 3.87–5.11)
RDW: 13.7 % (ref 11.5–15.5)
WBC: 11.4 10*3/uL — ABNORMAL HIGH (ref 4.0–10.5)
nRBC: 0 % (ref 0.0–0.2)

## 2020-09-22 MED ORDER — DIBUCAINE (PERIANAL) 1 % EX OINT: 1.0000 "application " | TOPICAL_OINTMENT | CUTANEOUS | Status: DC | PRN

## 2020-09-22 MED ORDER — WITCH HAZEL-GLYCERIN EX PADS: 1.0000 "application " | MEDICATED_PAD | CUTANEOUS | 12 refills | Status: DC

## 2020-09-22 MED ORDER — BENZOCAINE-MENTHOL 20-0.5 % EX AERO
1.0000 | INHALATION_SPRAY | CUTANEOUS | Status: DC | PRN
Start: 2020-09-22 — End: 2020-11-03

## 2020-09-22 MED ORDER — DOCUSATE SODIUM 100 MG PO CAPS: 100.0000 mg | ORAL_CAPSULE | Freq: Two times a day (BID) | ORAL | 0 refills | Status: DC

## 2020-09-22 MED ORDER — IBUPROFEN 600 MG PO TABS: 600.0000 mg | ORAL_TABLET | Freq: Four times a day (QID) | ORAL | 0 refills | Status: DC

## 2020-09-22 MED ORDER — ACETAMINOPHEN 500 MG PO TABS: 1000.0000 mg | ORAL_TABLET | Freq: Four times a day (QID) | ORAL | 0 refills | Status: DC | PRN

## 2020-09-22 MED ORDER — COCONUT OIL OIL: 1.0000 "application " | TOPICAL_OIL | 0 refills | Status: DC | PRN

## 2020-09-22 NOTE — Progress Notes (Signed)
RN in room to do infant hearing screen; father of baby asking about formula; RN used interpreter ipad Byrd Hesselbach B [phone]); father of baby kept saying "she has no milk"; RN discussed colostrum, breast feeding and formula feeding, keep putting baby to breast if mom interested in still breastfeeding and appropriate amount of formula so as to not overfeed baby; at 0200 RN fed baby 2mL

## 2020-09-22 NOTE — Anesthesia Postprocedure Evaluation (Signed)
Anesthesia Post Note  Patient: Amber Potter  Procedure(s) Performed: AN AD HOC LABOR EPIDURAL  Patient location during evaluation: Mother Baby Anesthesia Type: Epidural Level of consciousness: awake and alert Pain management: pain level controlled Vital Signs Assessment: post-procedure vital signs reviewed and stable Respiratory status: spontaneous breathing, nonlabored ventilation and respiratory function stable Cardiovascular status: stable Postop Assessment: no headache, no backache and epidural receding Anesthetic complications: no   No notable events documented.   Last Vitals:  Vitals:   09/21/20 2300 09/22/20 0800  BP: 97/62 113/61  Pulse: 74 65  Resp: 18 20  Temp: 36.8 C 36.8 C  SpO2: 98% 99%    Last Pain:  Vitals:   09/22/20 0800  TempSrc: Oral  PainSc:                  Karoline Caldwell

## 2020-09-22 NOTE — Progress Notes (Signed)
Pt d/c home in the company of husband and baby. D/c instructions reviewed with pt and husband with interpreter present. IV d/c'd with catheter intact. Pt and husband verbalized understanding of d/c orders.

## 2020-09-22 NOTE — Progress Notes (Signed)
Called [facility] to inform them of patient's recent delivery of baby girl on 09/21/20, and concerns with her current mental state and adjustment. Called x2 on hold for 10 mins each call, and was disconnected each time without speaking to anyone.

## 2020-09-24 ENCOUNTER — Ambulatory Visit: Payer: Self-pay

## 2020-09-29 ENCOUNTER — Other Ambulatory Visit: Payer: Self-pay

## 2020-09-29 ENCOUNTER — Ambulatory Visit: Payer: Self-pay | Admitting: Advanced Practice Midwife

## 2020-09-29 DIAGNOSIS — F321 Major depressive disorder, single episode, moderate: Secondary | ICD-10-CM

## 2020-09-29 DIAGNOSIS — Z8659 Personal history of other mental and behavioral disorders: Secondary | ICD-10-CM

## 2020-09-29 NOTE — Progress Notes (Signed)
S: couple here 1 wk pp and not sure why they were told by delivering MD to come here today.   Reviewed L&D notes with SVD F 6#6 Sophia at 39 5/7 on 09/21/20 after AROM. Nursing q hour and bottle feeds with formula q 2 hours. 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 Children are with PGM now while couple is here.  20 yo G2P2 living with FOB and her 2 children (1 1/2 yo son and newborn).  Upon review of L&D notes, a psych and TOC team evaluation was ordered in hospital due to "hx depression, affect is labile and flat, depressed mood, behavior is withdrawn." Delivering MD ordered 1 week psych f/u with [place]. Will also have 2 wk pp mood check with Kathreen Cosier, LCSW.  FOB and pt exhibit sl inappropriate behavior (chuckling, laughing inappropriately at provider and RN questions).  -cry, poor sleep, poor appetite, energy level wnl, -SI/HI.  Good eye contact with provider  O: 105/66, 76, 97.6, LMP 11/21/19       Red lochia rubra, denies pp coitus       Good eye contact with pt A/P: hx depression         Marchelle Folks will do 2 wk pp mood check         Wants IUD insertion at pp apt

## 2020-09-29 NOTE — Progress Notes (Signed)
Vital Signs: 97.6 (oral), 76 (radial), 105/66, weight 133.6#. LMP 11/21/2019. NSVD   6# 6.3 oz female at 42 5/7 on 09/21/2020 at [facility]. Presents today as told to follow-up with [facility] provider in one week. Breast and formula feeding infant. Jossie Ng, RN

## 2020-10-26 ENCOUNTER — Ambulatory Visit: Payer: Self-pay

## 2020-10-27 ENCOUNTER — Ambulatory Visit: Payer: Self-pay

## 2020-11-01 ENCOUNTER — Encounter: Payer: Self-pay | Admitting: Advanced Practice Midwife

## 2020-11-01 ENCOUNTER — Ambulatory Visit: Payer: Self-pay | Admitting: Advanced Practice Midwife

## 2020-11-01 ENCOUNTER — Other Ambulatory Visit: Payer: Self-pay

## 2020-11-01 VITALS — BP 101/65 | HR 68 | Temp 97.8°F | Ht 60.0 in | Wt 132.0 lb

## 2020-11-01 DIAGNOSIS — F321 Major depressive disorder, single episode, moderate: Secondary | ICD-10-CM

## 2020-11-01 DIAGNOSIS — F4320 Adjustment disorder, unspecified: Secondary | ICD-10-CM

## 2020-11-01 DIAGNOSIS — Z8659 Personal history of other mental and behavioral disorders: Secondary | ICD-10-CM

## 2020-11-01 DIAGNOSIS — Z550 Illiteracy and low-level literacy: Secondary | ICD-10-CM

## 2020-11-01 LAB — WET PREP FOR TRICH, YEAST, CLUE
Trichomonas Exam: NEGATIVE
Yeast Exam: NEGATIVE

## 2020-11-01 LAB — HEMOGLOBIN, FINGERSTICK: Hemoglobin: 14 g/dL (ref 11.1–15.9)

## 2020-11-01 NOTE — Progress Notes (Signed)
Post Partum Exam  Amber Potter is a 20 y.o. SHF [id number] (1 yo, infant) nonsmoker female who presents for a postpartum visit. She is 6 weeks postpartum following a spontaneous vaginal delivery. I have fully reviewed the prenatal and intrapartum course. The delivery was at 39 5/7 gestational weeks.  Anesthesia: epidural. Postpartum course has been wnl. Baby's course has been wnl. Baby is feeding by Breast Bleeding no bleeding. Bowel function is normal. Bladder function is normal. Patient is not sexually active. Contraception method is abstinence. Last sex before birth per pt. Nursing q hour. Doesn't know how much baby weighed at PEDS. SVD F 6#6 at 39 5/7 after AROM on 09/21/20. Living with FOB and her 2 children  Postpartum depression screening:  Edinburgh Postnatal Depression Scale - 11/01/20 1135       Edinburgh Postnatal Depression Scale:  In the Past 7 Days   I have been able to laugh and see the funny side of things. 0    I have looked forward with enjoyment to things. 0    I have blamed myself unnecessarily when things went wrong. 0    I have been anxious or worried for no good reason. 0    I have felt scared or panicky for no good reason. 0    Things have been getting on top of me. 0    I have been so unhappy that I have had difficulty sleeping. 0    I have felt sad or miserable. 0    I have been so unhappy that I have been crying. 0    The thought of harming myself has occurred to me. 0    Edinburgh Postnatal Depression Scale Total 0              The following portions of the patient's history were reviewed and updated as appropriate: allergies, current medications, past family history, past medical history, past social history, past surgical history, and problem list. Last pap smear done 07/10/19 and was Normal  Review of Systems Pertinent items are noted in HPI.    Objective:  BP 101/65   Pulse 68   Temp 97.8 F (36.6 C)   Ht 5' (1.524 m)   Wt 132 lb (59.9  kg)   Breastfeeding Yes   BMI 25.78 kg/m   Gen: well appearing, NAD HEENT: no scleral icterus CV: RR Lung: Normal WOB Breast:performed-yes  Ext: warm well perfused  GU: external genitalia wnl, vagina with clear leukorrhea, ph >4.5, uterus NSSC,  Rectal: performed -  not indicated       Assessment:    6 wks postpartum exam. Pap smear not done at today's visit.   Plan:   Essential components of care per ACOG recommendations for Comprehensive Postpartum exam:  1.  Mood and well being: Patient with negative depression screening today. Reviewed local resources for support. EPDS is low risk. Reviewed resources and that mood sx in first year after pregnancy are considered related to pregnancy and to reach out for help at [facility] if needed. Discussed [facility] as link to care and availability of LCSW for counseling  - Patient does not use tobacco.  - hx of drug use? No   If yes, discussed support systems in place  2. Infant care and feeding:  -Patient currently breastmilk feeding? Yes If breastmilk feeding discussed return to work and pumping. If needed, patient was provided letter for work to allow for every 2-3 hr pumping breaks, and to be granted a  private location to express breastmilk and refrigerated area to store breastmilk. Reviewed importance of draining breast regularly to support lactation. I  -Recommended patient engage with WIC/BFpeer counselors  -Counseled to sign new child up for [facility] services -Social determinants of health (SDOH) reviewed in EPIC. No concerns The following needs were identified   3. Sexuality, contraception and birth spacing  Contraception: Contraception counseling: Reviewed all forms of birth control options in the tiered based approach. available including abstinence; over the counter/barrier methods; hormonal contraceptive medication including pill, patch, ring, injection,contraceptive implant; hormonal and nonhormonal IUDs; permanent sterilization options  including vasectomy and the various tubal sterilization modalities. Risks, benefits, and typical effectiveness rates were reviewed.  Questions were answered.  Written information was also given to the patient to review.  Patient desires Paraguard, this was prescribed for patient. She will follow up in  2 days for surveillance.  She was told to call with any further questions, or with any concerns about this method of contraception.  Emphasized use of condoms 100% of the time for STI prevention. Has appt 11/03/20 for Paraguard insertion  Patient was offered ECP. ECP was not accepted by the patient. ECP counseling was not given - see RN documentation  - Patient does not want a pregnancy in the next year.  Desired family size is 2 children.  - Reviewed forms of contraception in tiered fashion. Patient desired abstinence today.   - Discussed birth spacing of 18 months  4. Sleep and fatigue -Encouraged family/partner/community support of 4 hrs of uninterrupted sleep to help with mood and fatigue  5. Physical Recovery  - Discussed patients delivery and complications - Patient had a first degree laceration, perineal healing reviewed. Patient expressed understanding - Patient has urinary incontinence? No  - Patient is not safe to resume physical and sexual activity  6.  Health Maintenance/Chronic Disease Health Maintenance Due  Topic Date Due   HPV VACCINES (1 - 2-dose series) Never done   COVID-19 Vaccine (3 - Booster for Moderna series) 08/09/2020   INFLUENZA VACCINE  08/16/2020    - Last pap smear performed 07/10/19  and was normal with negative HPV. Mammogram  1. History of depression Pt refused to see Kathreen Cosier for 2 wk mood check Seen here at 1 wk pp - Hemoglobin, venipuncture  2. Adjustment disorder, unspecified type   3. Illiteracy   4. Major depressive disorder, single episode, moderate (HCC)   5. Postpartum exam  - WET PREP FOR TRICH, YEAST, CLUE -  Chlamydia/Gonorrhea [facility]   Patient given handout about PCP care in the community Given MVI per family planning program guidelines and availability  Follow up in: 2 days or as needed.

## 2020-11-01 NOTE — Progress Notes (Signed)
Salli Real read client's forms to her prior to client signature. Jossie Ng, RN Wet prep and hgb results reviewed. No interventions required per standing order. Jossie Ng, RN

## 2020-11-03 ENCOUNTER — Ambulatory Visit (LOCAL_COMMUNITY_HEALTH_CENTER): Payer: Self-pay | Admitting: Advanced Practice Midwife

## 2020-11-03 ENCOUNTER — Other Ambulatory Visit: Payer: Self-pay

## 2020-11-03 ENCOUNTER — Encounter: Payer: Self-pay | Admitting: Advanced Practice Midwife

## 2020-11-03 VITALS — BP 108/60 | HR 60 | Temp 97.9°F | Resp 16 | Ht 60.0 in | Wt 134.6 lb

## 2020-11-03 DIAGNOSIS — Z3009 Encounter for other general counseling and advice on contraception: Secondary | ICD-10-CM

## 2020-11-03 DIAGNOSIS — Z30430 Encounter for insertion of intrauterine contraceptive device: Secondary | ICD-10-CM

## 2020-11-03 MED ORDER — PARAGARD INTRAUTERINE COPPER IU IUD
1.0000 | INTRAUTERINE_SYSTEM | Freq: Once | INTRAUTERINE | Status: AC
  Administered 2020-11-03: 1 via INTRAUTERINE

## 2020-11-03 NOTE — Progress Notes (Signed)
Contraception/Family Planning VISIT ENCOUNTER NOTE  Subjective:   Amber Potter is a 20 y.o. HF nonsmoker [id number] female here for reproductive life counseling.  Desires Paraguard for [employer].  Reports she does not want a pregnancy in the next year. Denies abnormal vaginal bleeding, discharge, pelvic pain, problems with intercourse or other gynecologic concerns. 6 wk pp with SVD at 39 5/7. Last sex before birth. Breastfeeding infant.   Gynecologic History No LMP recorded. Contraception: abstinence  Health Maintenance Due  Topic Date Due   HPV VACCINES (1 - 2-dose series) Never done   COVID-19 Vaccine (3 - Booster for Moderna series) 08/09/2020   INFLUENZA VACCINE  08/16/2020     The following portions of the patient's history were reviewed and updated as appropriate: allergies, current medications, past family history, past medical history, past social history, past surgical history and problem list.  Review of Systems Pertinent items are noted in HPI.   Objective:  BP 108/60 (Cuff Size: Normal)   Pulse 60   Temp 97.9 F (36.6 C) (Oral)   Resp 16   Ht 5' (1.524 m)   Wt 134 lb 9.6 oz (61.1 kg)   Breastfeeding Yes   BMI 26.29 kg/m  Gen: well appearing, NAD HEENT: no scleral icterus CV: RR Lung: Normal WOB Ext: warm well perfused  PELVIC: Normal appearing external genitalia; normal appearing vaginal mucosa and cervix.  No abnormal discharge noted.  Normal uterine size, no other palpable masses, no uterine or adnexal tenderness.   Assessment and Plan:   Contraception counseling: Reviewed all forms of birth control options in the tiered based approach. available including abstinence; over the counter/barrier methods; hormonal contraceptive medication including pill, patch, ring, injection,contraceptive implant, ECP; hormonal and nonhormonal IUDs; permanent sterilization options including vasectomy and the various tubal sterilization modalities. Risks, benefits, and typical  effectiveness rates were reviewed.  Questions were answered.  Written information was also given to the patient to review.  Patient desires Paraguard, this was prescribed for patient. She will follow up in  prn for surveillance.  She was told to call with any further questions, or with any concerns about this method of contraception.  Emphasized use of condoms 100% of the time for STI prevention.  Patient was offered ECP. ECP was not accepted by the patient. ECP counseling was not given - see RN documentation  1. Encounter for IUD insertion    Patient presented to [facility] for IUD insertion. Her GC/CT screening was found to be up to date and using WHO criteria we can be reasonably certain she is not pregnant or a pregnancy test was obtained which was Urine pregnancy test  today was N/A.  See Flowsheet for IUD check list  IUD Insertion Procedure Note Patient identified, informed consent performed, consent signed.   Discussed risks of irregular bleeding, cramping, infection, malpositioning or misplacement of the IUD outside the uterus which may require further procedure such as laparoscopy. Time out was performed.    Speculum placed in the vagina.  Cervix visualized.  Cleaned with Betadine x 2.  Cx Grasped anteriorly with a single tooth tenaculum.  Uterus sounded to 8 cm.  IUD placed per manufacturer's recommendations.  Strings trimmed to 3 cm. Tenaculum was removed, good hemostasis noted.  Patient tolerated procedure well.   Patient was given post-procedure instructions- both agency handout and verbally by provider.  She was advised to have backup contraception for one week.  Patient was also asked to check IUD strings periodically or follow up in  4 weeks for IUD check.  - paragard intrauterine copper IUD 1 each .achd  2. Family planning     Please refer to After Visit Summary for other counseling recommendations.   Return if symptoms worsen or fail to improve.  Alberteen Spindle,  CNM [facility] DEPARTMENT

## 2022-01-30 IMAGING — US US MFM UA DOPPLER RE-EVAL
1 series · 15 of 20 positions shown · non-contrast
Comparison: none

[Series 1: us mfm ua doppler re-eval · 15 of 20 slices shown]
[im 1/20]
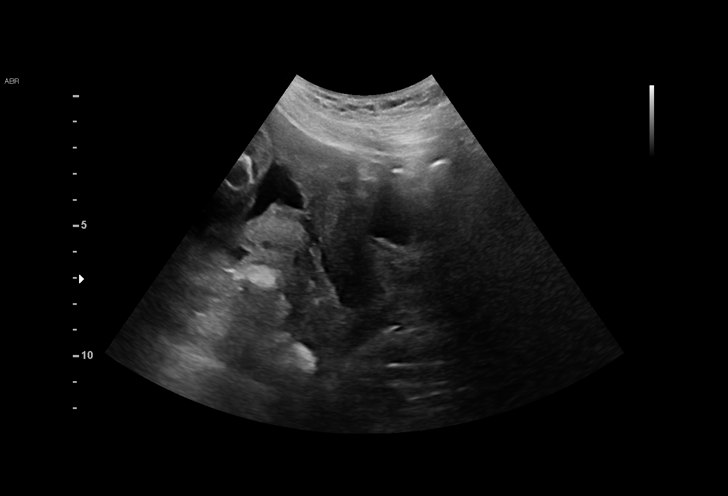
[im 3/20]
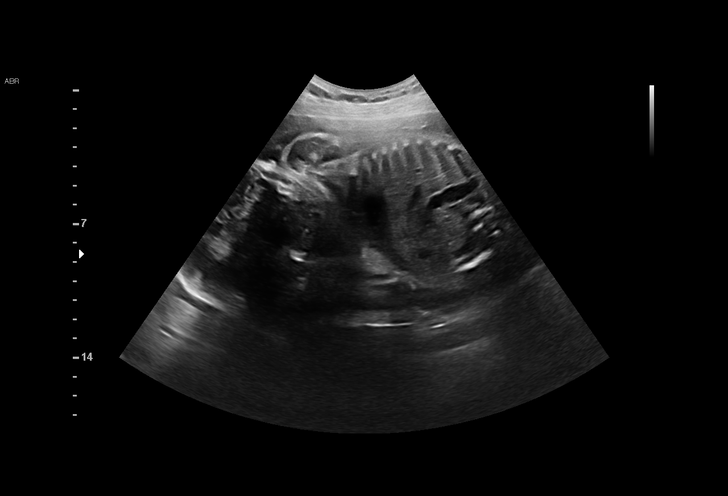
[im 4/20]
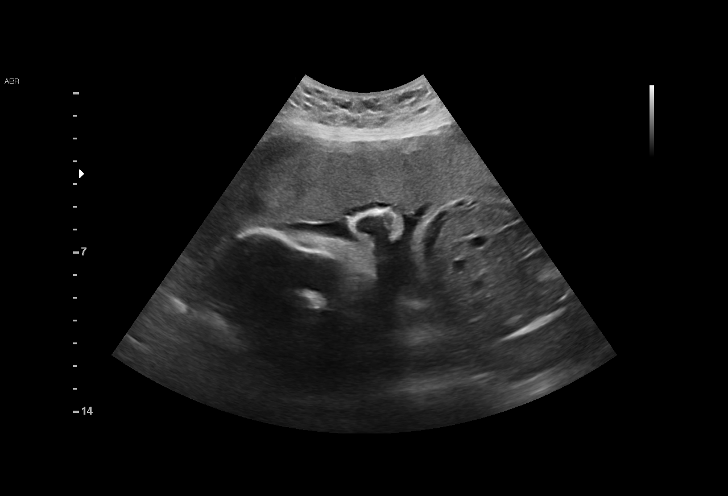
[im 5/20]
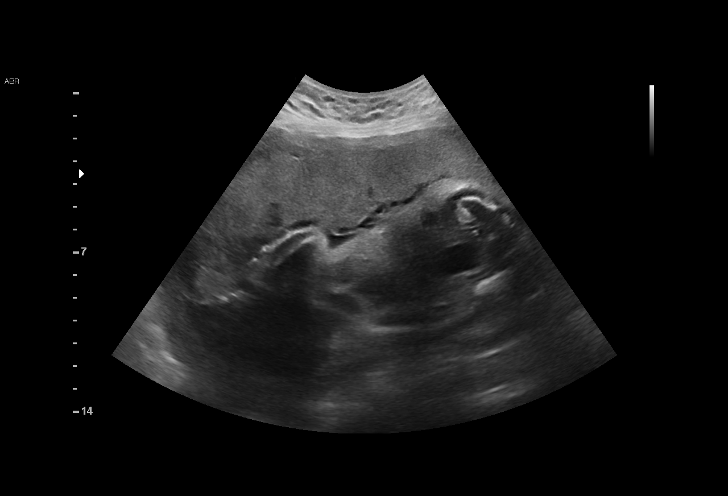
[im 7/20]
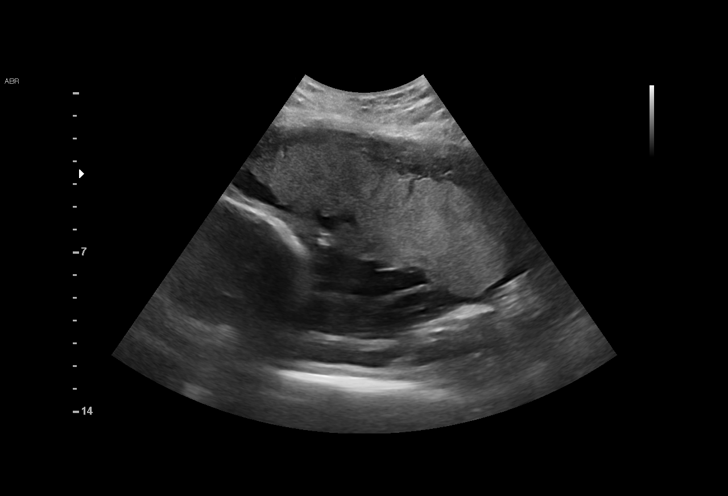
[im 8/20]
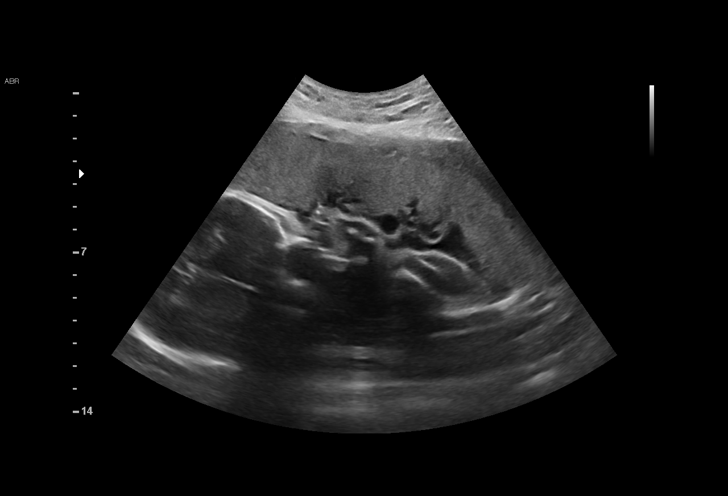
[im 9/20]
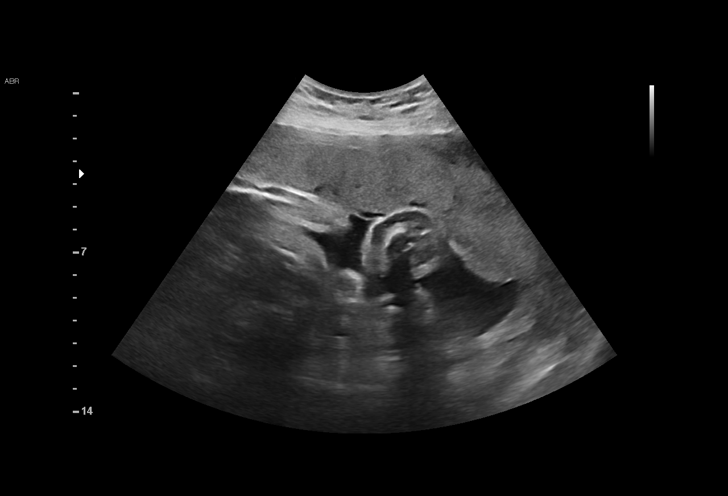
[im 11/20]
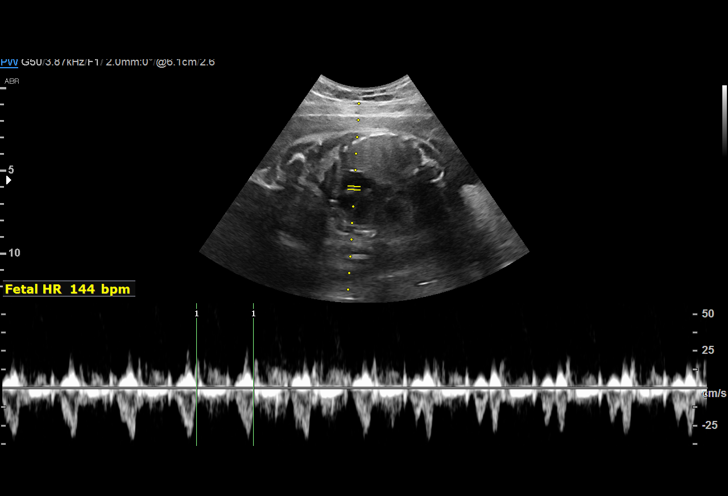
[im 12/20]
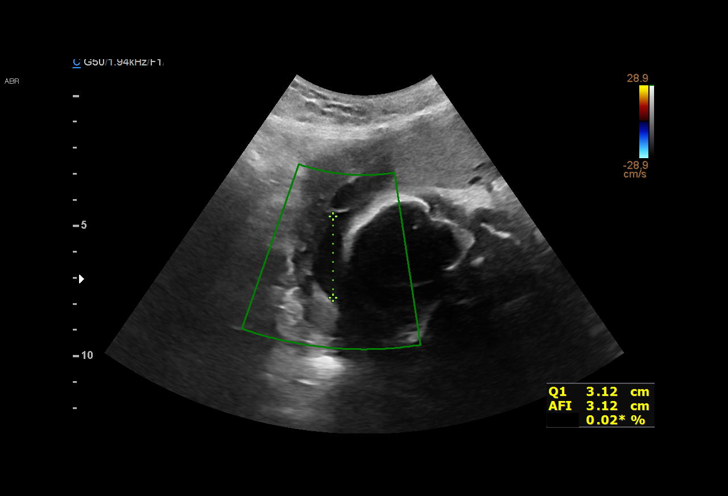
[im 13/20]
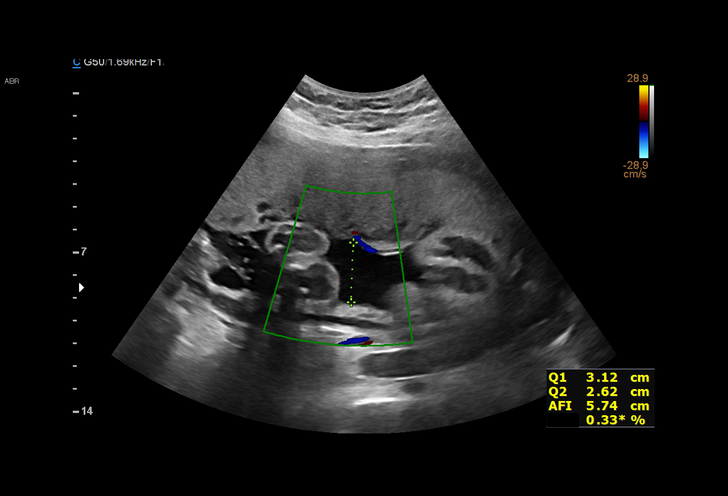
[im 15/20]
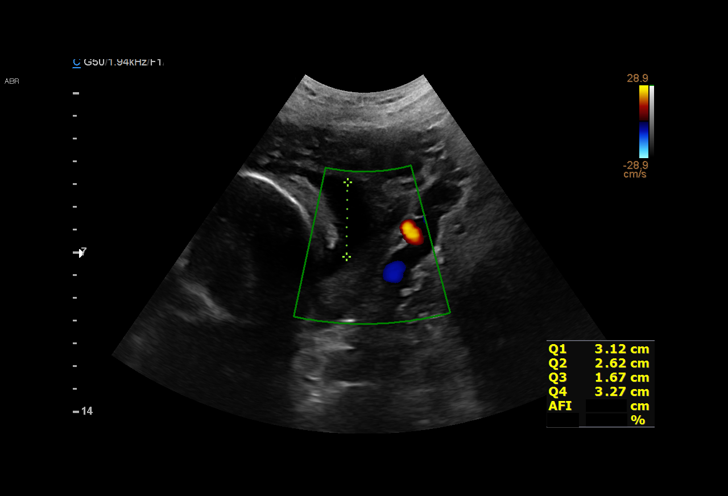
[im 16/20]
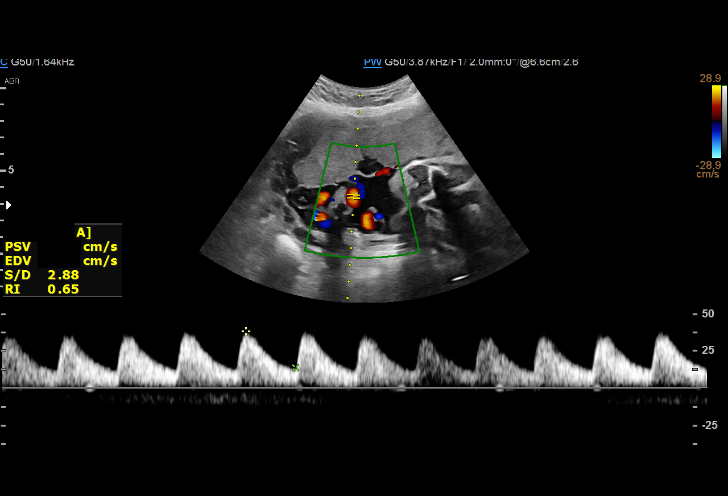
[im 17/20]
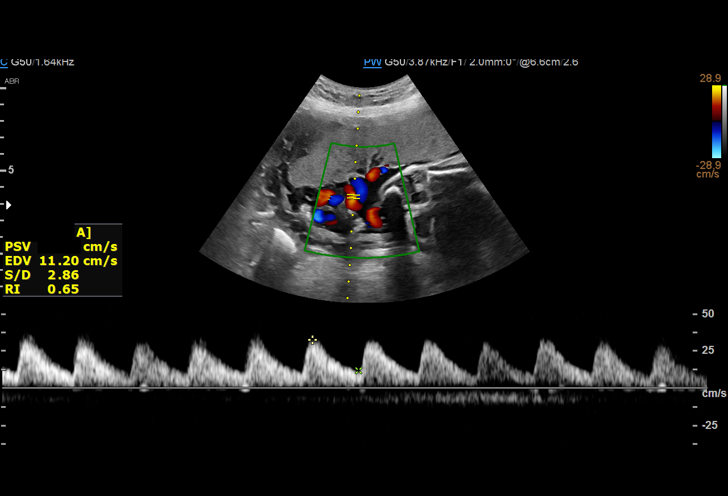
[im 19/20]
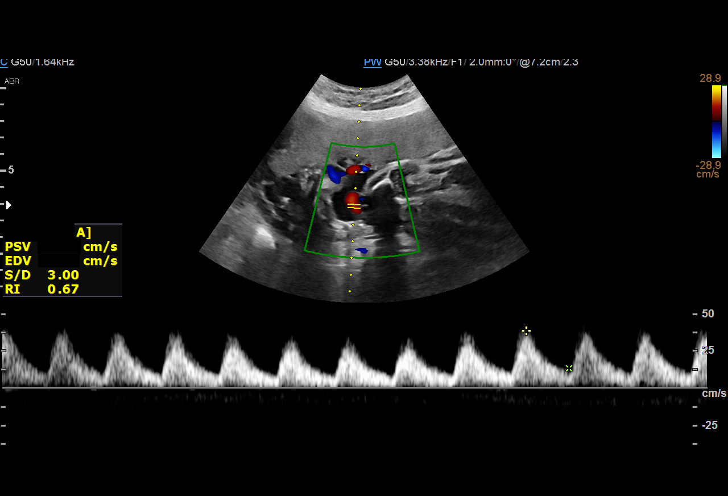
[im 20/20]
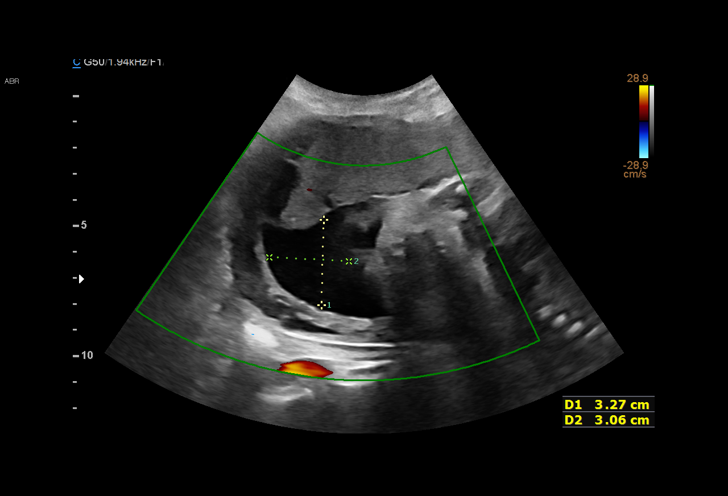

[15 of 20 positions shown; findings below may reference images not displayed]

1  US MFM UA DOPPLER RE-EVAL             76820.04    METEEA [REDACTED]

Indications

 29 weeks gestation of pregnancy
 Maternal care for known or suspected poor
 fetal growth, third trimester, not applicable or
 unspecified IUGR
Fetal Evaluation

 Num Of Fetuses:         1
 Fetal Heart Rate(bpm):  144
 Cardiac Activity:       Observed
 Presentation:           Transverse, head to maternal right
 Placenta:               Anterior
 P. Cord Insertion:      Previously Visualized

 AFI Sum(cm)     %Tile       Largest Pocket(cm)
 10.7            18

 RUQ(cm)       RLQ(cm)       LUQ(cm)        LLQ(cm)

Gestational Age

 LMP:           32w 6d        Date:  11/21/19                 EDD:   08/27/20
 Best:          29w 0d     Det. By:  U/S C R L  (02/19/20)    EDD:   09/23/20
Doppler - Fetal Vessels

 Umbilical Artery
  S/D     %tile      RI    %tile
     3       54    0.67       60
Cervix Uterus Adnexa

 Cervix
 Length:           3.33  cm.
Impression

 Antenatal testing due to FGR with an EFW 6th% with an AC
 < 5th%.
 UA Dopplers are normal with no evidence of AEDF or REDF
 There was good fetal movement and amniotic fluid volume.

 I have scheduled Ms.Nadira to return in 1 week for UA
 Dopplers/NST and Fluid assessment.

 The results were communicated through a spanish interpreter.

 All questions answered.
Recommendations

 Return in 1 week for UA Dopplers/NST and Fluid
 assessment.

## 2022-02-27 IMAGING — US US MFM OB FOLLOW-UP
1 series · 14 of 28 positions shown · non-contrast
Comparison: none

[Series 1: us mfm ob follow-up · 14 of 51 slices shown]
[im 2/51]
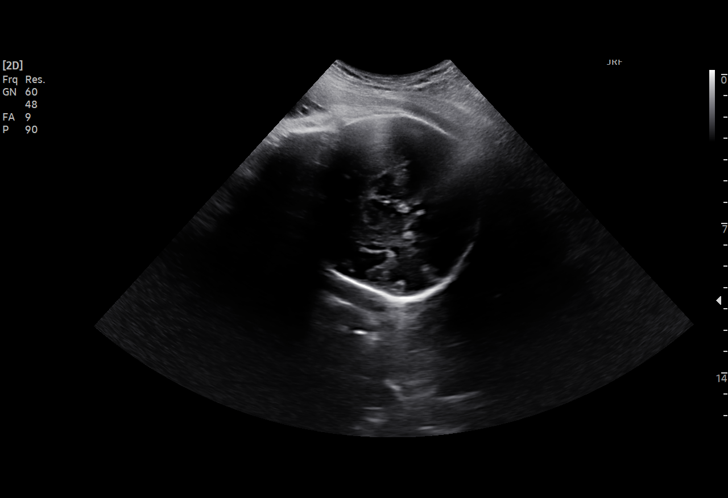
[im 6/51]
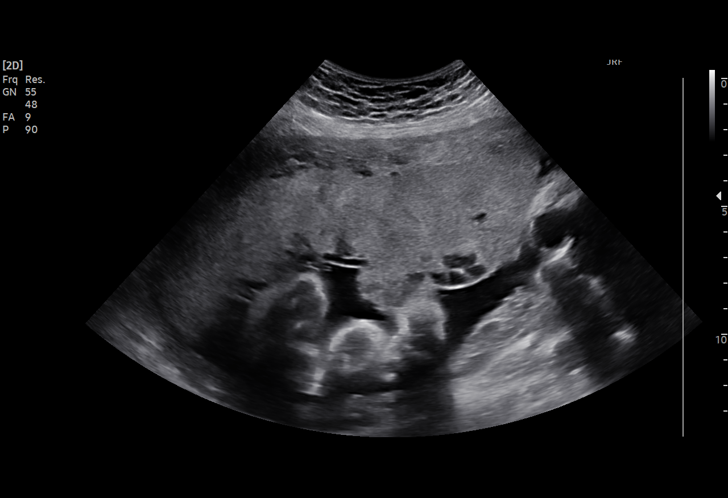
[im 10/51]
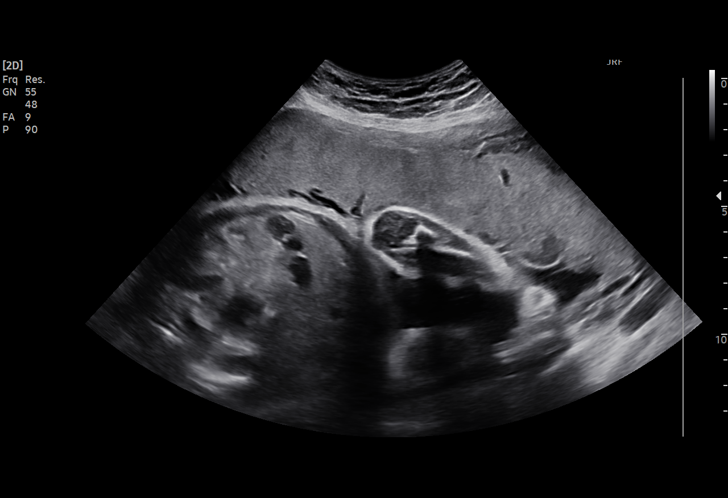
[im 13/51]
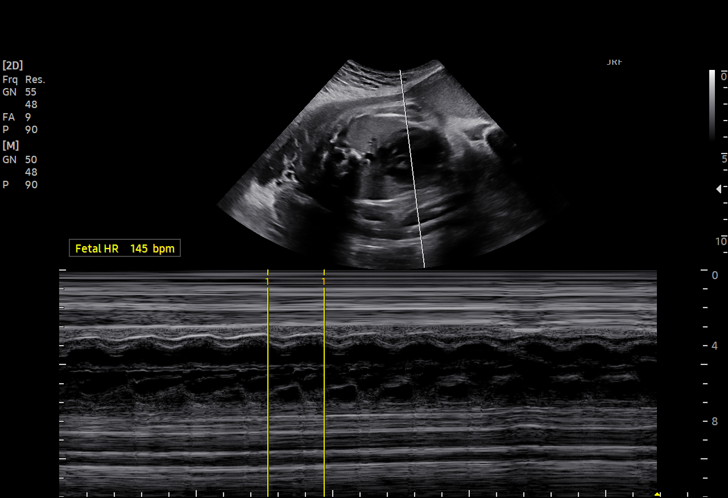
[im 17/51]
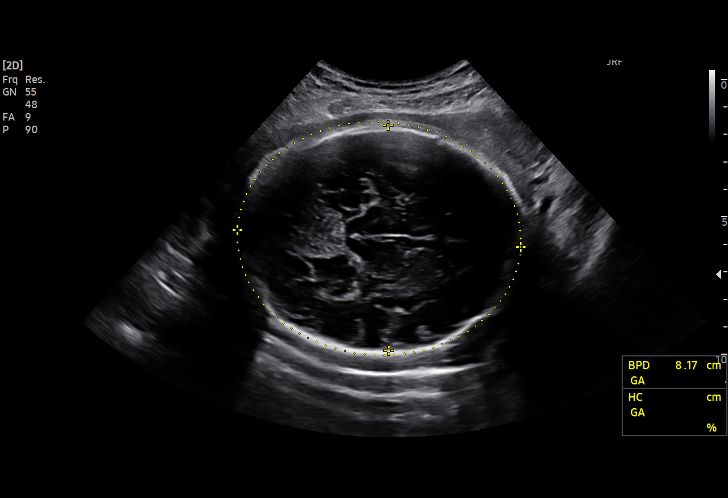
[im 21/51]
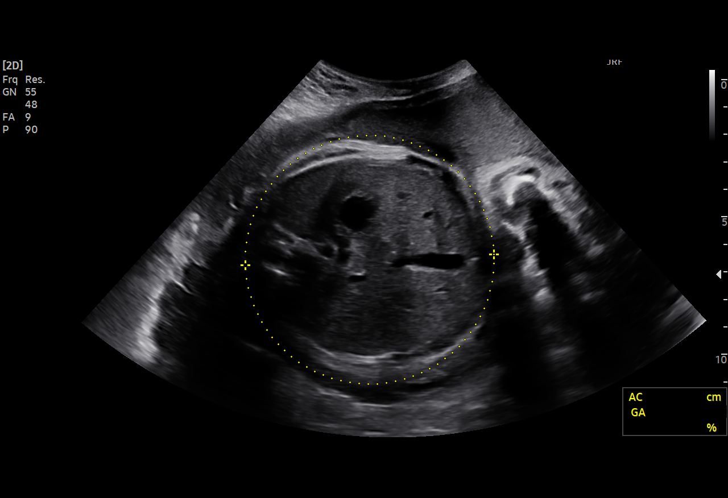
[im 25/51]
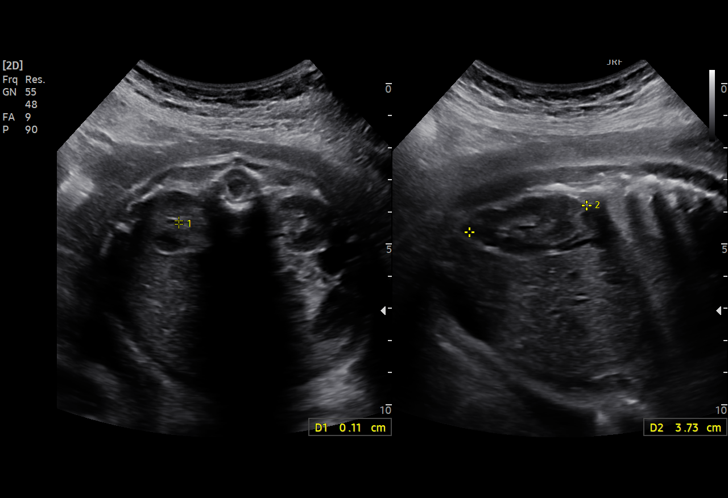
[im 28/51]
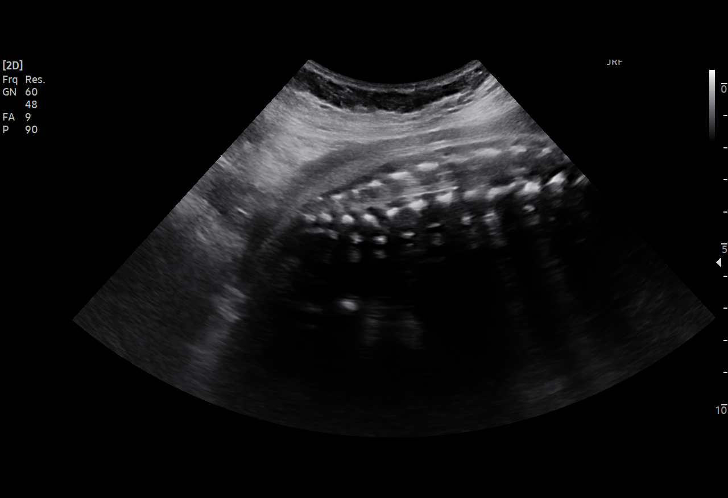
[im 32/51]
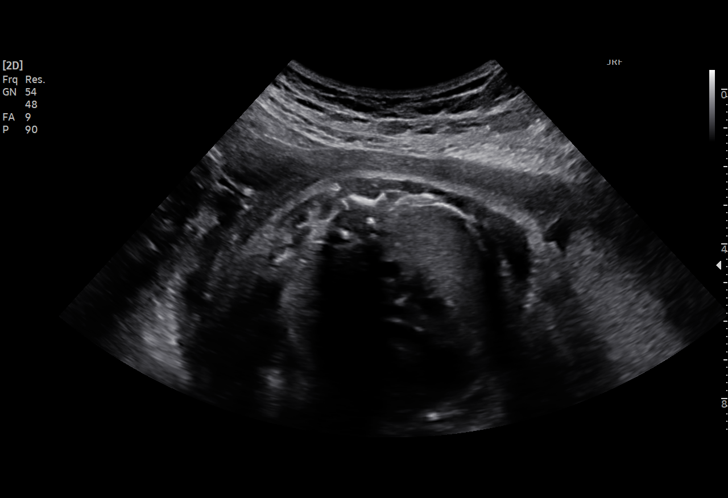
[im 36/51]
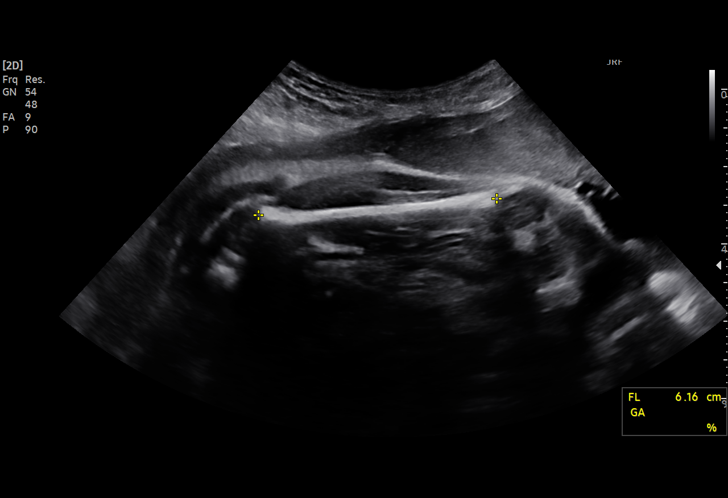
[im 39/51]
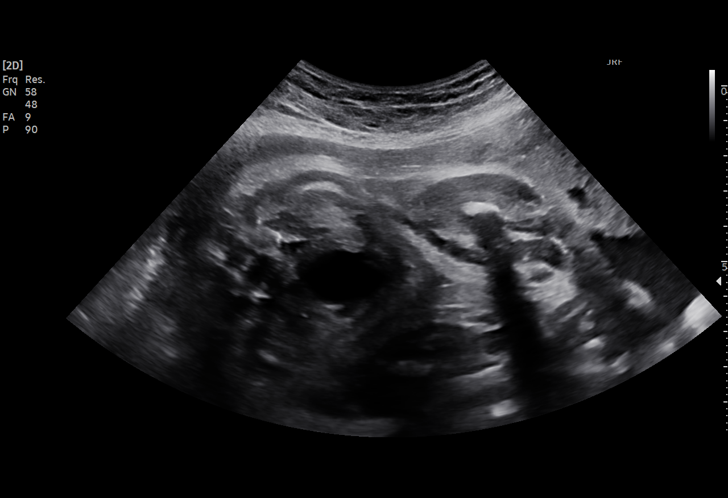
[im 43/51]
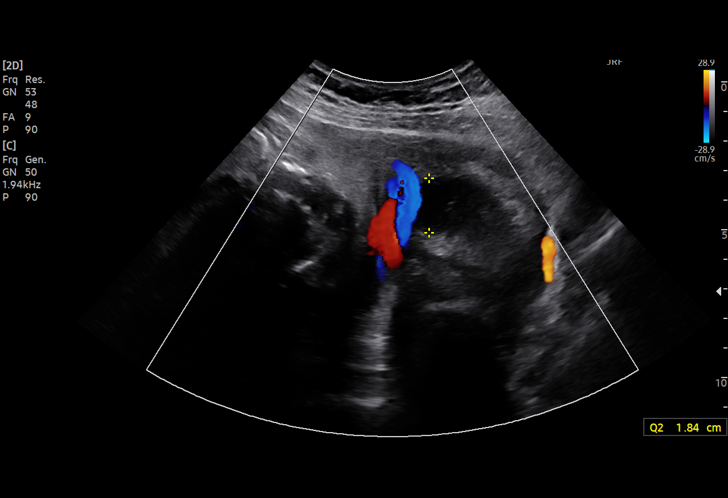
[im 47/51]
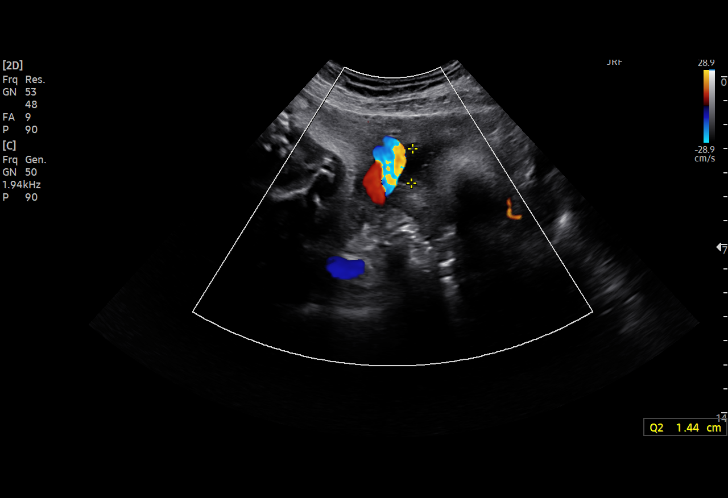
[im 51/51]
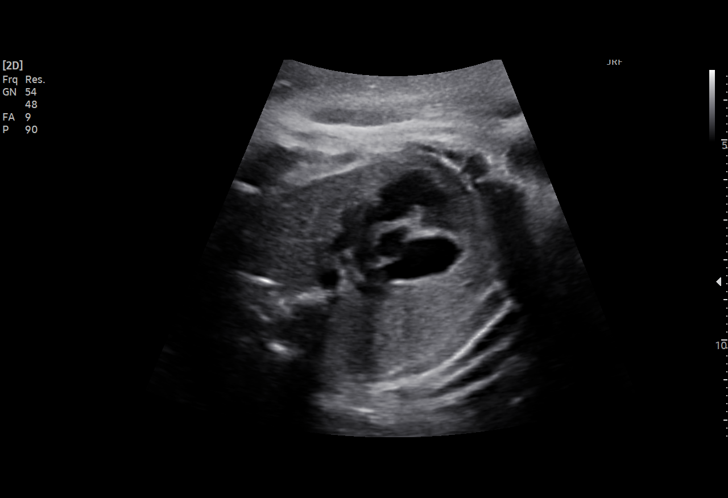

[14 of 28 positions shown; findings below may reference images not displayed]

Indications

 33 weeks gestation of pregnancy
 Maternal care for known or suspected poor
 fetal growth, third trimester, not applicable or
 unspecified IUGR
Fetal Evaluation

 Num Of Fetuses:         1
 Fetal Heart Rate(bpm):  145
 Cardiac Activity:       Observed
 Presentation:           Cephalic
 Placenta:               Anterior

 AFI Sum(cm)     %Tile       Largest Pocket(cm)
 8.4             6           4

 RUQ(cm)       RLQ(cm)       LUQ(cm)        LLQ(cm)
 0             1.8           4
Biometry

 BPD:      81.6  mm     G. Age:  32w 6d         37  %    CI:        75.95   %    70 - 86
                                                         FL/HC:      20.6   %    19.9 -
 HC:      296.8  mm     G. Age:  32w 6d         12  %    HC/AC:      1.05        0.96 -
 AC:      282.1  mm     G. Age:  32w 2d         29  %    FL/BPD:     74.8   %    71 - 87
 FL:         61  mm     G. Age:  31w 5d         10  %    FL/AC:      21.6   %    20 - 24
 HUM:      55.2  mm     G. Age:  32w 1d         41  %

 Est. FW:    2467  gm      4 lb 4 oz     19  %
Gestational Age
 LMP:           36w 6d        Date:  11/21/19                 EDD:   08/27/20
 U/S Today:     32w 3d                                        EDD:   09/27/20
 Best:          33w 0d     Det. By:  U/S C R L  (02/19/20)    EDD:   09/23/20
Anatomy

 Cranium:               Appears normal         Aortic Arch:            Previously seen
 Cavum:                 Appears normal         Ductal Arch:            Previously seen
 Ventricles:            Appears normal         Diaphragm:              Appears normal
 Choroid Plexus:        Previously seen        Stomach:                Appears normal, left
                                                                       sided
 Cerebellum:            Previously seen        Abdomen:                Appears normal
 Posterior Fossa:       Previously seen        Abdominal Wall:         Previously seen
 Nuchal Fold:           Not applicable (>20    Cord Vessels:           Previously seen
                        wks GA)
 Face:                  Orbits and profile     Kidneys:                Appear normal
                        previously seen
 Lips:                  Previously seen        Bladder:                Appears normal
 Thoracic:              Appears normal         Spine:                  Appears normal
 Heart:                 Appears normal         Upper Extremities:      Previously seen
                        (4CH, axis, and
                        situs)
 RVOT:                  Appears normal         Lower Extremities:      Previously seen
 LVOT:                  Previously seen
Cervix Uterus Adnexa

 Cervix
 Length:            3.8  cm.
Impression

 Follow up growth due small for gestational age.
 Normal interval growth with measurements consistent with
 dates
 Good fetal movement and amniotic fluid volume
Recommendations

 Follow up growth in 4 weeks.
